# Patient Record
Sex: Male | Born: 1940 | Race: Black or African American | Hispanic: No | Marital: Single | Smoking: Never smoker
Health system: Southern US, Community
[De-identification: ages and names within clinical notes are randomized; demographics above are authoritative.]

## PROBLEM LIST (undated history)

## (undated) DIAGNOSIS — K5790 Diverticulosis of intestine, part unspecified, without perforation or abscess without bleeding: Secondary | ICD-10-CM

## (undated) DIAGNOSIS — I10 Essential (primary) hypertension: Secondary | ICD-10-CM

## (undated) DIAGNOSIS — K222 Esophageal obstruction: Secondary | ICD-10-CM

## (undated) DIAGNOSIS — K208 Other esophagitis without bleeding: Secondary | ICD-10-CM

## (undated) DIAGNOSIS — E119 Type 2 diabetes mellitus without complications: Secondary | ICD-10-CM

## (undated) HISTORY — DX: Esophageal obstruction: K22.2

## (undated) HISTORY — DX: Other esophagitis without bleeding: K20.80

---

## 2013-11-30 ENCOUNTER — Encounter (HOSPITAL_BASED_OUTPATIENT_CLINIC_OR_DEPARTMENT_OTHER): Payer: Self-pay | Admitting: Emergency Medicine

## 2013-11-30 ENCOUNTER — Emergency Department (HOSPITAL_BASED_OUTPATIENT_CLINIC_OR_DEPARTMENT_OTHER)
Admission: EM | Admit: 2013-11-30 | Discharge: 2013-11-30 | Disposition: A | Discharge status: Home / Self Care | Payer: Medicare HMO | Attending: Emergency Medicine | Admitting: Emergency Medicine

## 2013-11-30 DIAGNOSIS — E119 Type 2 diabetes mellitus without complications: Secondary | ICD-10-CM | POA: Insufficient documentation

## 2013-11-30 DIAGNOSIS — I10 Essential (primary) hypertension: Secondary | ICD-10-CM | POA: Insufficient documentation

## 2013-11-30 DIAGNOSIS — Y939 Activity, unspecified: Secondary | ICD-10-CM | POA: Insufficient documentation

## 2013-11-30 DIAGNOSIS — Y929 Unspecified place or not applicable: Secondary | ICD-10-CM | POA: Insufficient documentation

## 2013-11-30 DIAGNOSIS — T63461A Toxic effect of venom of wasps, accidental (unintentional), initial encounter: Secondary | ICD-10-CM | POA: Insufficient documentation

## 2013-11-30 DIAGNOSIS — T6391XA Toxic effect of contact with unspecified venomous animal, accidental (unintentional), initial encounter: Secondary | ICD-10-CM | POA: Insufficient documentation

## 2013-11-30 HISTORY — DX: Type 2 diabetes mellitus without complications: E11.9

## 2013-11-30 HISTORY — DX: Essential (primary) hypertension: I10

## 2013-11-30 MED ORDER — DIPHENHYDRAMINE HCL 25 MG PO CAPS
25.0000 mg | ORAL_CAPSULE | Freq: Once | ORAL | Status: AC
  Administered 2013-11-30: 25 mg via ORAL
  Filled 2013-11-30: qty 1

## 2013-11-30 MED ORDER — PREDNISONE 20 MG PO TABS: ORAL_TABLET | ORAL | Status: DC

## 2013-11-30 MED ORDER — PREDNISONE 50 MG PO TABS
60.0000 mg | ORAL_TABLET | Freq: Once | ORAL | Status: AC
  Administered 2013-11-30: 60 mg via ORAL
  Filled 2013-11-30 (×2): qty 1

## 2013-11-30 NOTE — ED Provider Notes (Signed)
CSN: [id number]     Arrival date & time 11/30/13  2040 History  This chart was scribed for Manali Mcelmurry Smitty CordsK Raimi Guillermo-Rasch, MD by Nicholos Johnsenise Iheanachor, ED scribe. This patient was seen in room MH05/MH05 and the patient's care was started at 11:16 PM.    Chief Complaint  Patient presents with  . Insect Bite   Patient is a 73 y.o. male presenting with animal bite. The history is provided by the patient. No language interpreter was used.  Animal Bite Contact animal:  Insect Location:  Hand Hand injury location:  L hand and L fingers Time since incident:  4 hours Pain details:    Severity:  Mild Incident location:  Outside Notifications:  None Relieved by:  Cold compresses Worsened by:  Nothing tried Associated symptoms: swelling   Associated symptoms: no fever    HPI Comments: Thomas Rangel is a 73 y.o. male who presents to the Emergency Department complaining of wasp sting to the left hand; onset approximately 4 hours ago. States he washed with soap and water; applied vinegar to sting area and has been using ice to reduce swelling. No other sxs to report.  Past Medical History  Diagnosis Date  . Diabetes mellitus without complication   . Hypertension    History reviewed. No pertinent past surgical history. No family history on file. History  Substance Use Topics  . Smoking status: Never Smoker   . Smokeless tobacco: Not on file  . Alcohol Use: No    Review of Systems  Constitutional: Negative for fever.  All other systems reviewed and are negative.  Allergies  Review of patient's allergies indicates no known allergies.  Home Medications   Prior to Admission medications   Not on File   BP 163/75  Pulse 86  Temp(Src) 98.7 F (37.1 C) (Oral)  Resp 16  Ht 5\' 9"  (1.753 m)  Wt 202 lb (91.627 kg)  BMI 29.82 kg/m2  SpO2 96% Physical Exam  Nursing note and vitals reviewed. Constitutional: He is oriented to person, place, and time. He appears well-developed and well-nourished. No  distress.  HENT:  Head: Normocephalic and atraumatic.  Mouth/Throat: Oropharynx is clear and moist. No oropharyngeal exudate.  No swelling of the lips, tongue, or uvula.  Eyes: Conjunctivae and EOM are normal. Pupils are equal, round, and reactive to light.  Neck: Trachea normal and normal range of motion. Neck supple. No tracheal deviation present.  Cardiovascular: Normal rate.   Pulmonary/Chest: Effort normal. No stridor. No respiratory distress.  Abdominal: Soft. Bowel sounds are normal. There is no tenderness. There is no rebound.  Musculoskeletal: Normal range of motion.  Mild swelling over 3rd 4th and 5th knucles. 3+ radial pulse. Capillary refill less than 2 seconds. No hives. No red streaking up arm.   Neurological: He is alert and oriented to person, place, and time.  Skin: Skin is warm and dry.  Psychiatric: He has a normal mood and affect. His behavior is normal.    ED Course  Procedures (including critical care time) DIAGNOSTIC STUDIES: Oxygen Saturation is 96% on room air, normal by my interpretation.    COORDINATION OF CARE: At 11:18 PM: Discussed treatment plan with patient which includes treatment with Benadryl and Prednisone. Pt instructed to continue icing the area and elevate the arm to decrease swelling. Patient agrees.   Labs Review Labs Reviewed - No data to display  Imaging Review No results found.   EKG Interpretation None      MDM   Final diagnoses:  None  local reaction to wasp sting, steroids and q 6 hr benadryl .  Return for shortness of breath or swelling of the lips or tongue or any concerns.    I personally performed the services described in this documentation, which was scribed in my presence. The recorded information has been reviewed and is accurate.     Jasmine AweApril K Yordi Krager-Rasch, MD 12/01/13 [phone]

## 2013-11-30 NOTE — ED Notes (Signed)
MD at bedside. 

## 2013-11-30 NOTE — ED Notes (Signed)
Pt reports wasp sting to left hand just pta.  Slight swelling to hand.

## 2013-12-01 ENCOUNTER — Encounter (HOSPITAL_BASED_OUTPATIENT_CLINIC_OR_DEPARTMENT_OTHER): Payer: Self-pay | Admitting: Emergency Medicine

## 2014-04-08 ENCOUNTER — Encounter (HOSPITAL_BASED_OUTPATIENT_CLINIC_OR_DEPARTMENT_OTHER): Payer: Self-pay | Admitting: *Deleted

## 2014-04-08 ENCOUNTER — Emergency Department (HOSPITAL_BASED_OUTPATIENT_CLINIC_OR_DEPARTMENT_OTHER)
Admission: EM | Admit: 2014-04-08 | Discharge: 2014-04-08 | Disposition: A | Discharge status: Home / Self Care | Payer: No Typology Code available for payment source | Attending: Emergency Medicine | Admitting: Emergency Medicine

## 2014-04-08 DIAGNOSIS — Y9389 Activity, other specified: Secondary | ICD-10-CM | POA: Insufficient documentation

## 2014-04-08 DIAGNOSIS — I10 Essential (primary) hypertension: Secondary | ICD-10-CM | POA: Insufficient documentation

## 2014-04-08 DIAGNOSIS — Z7952 Long term (current) use of systemic steroids: Secondary | ICD-10-CM | POA: Diagnosis not present

## 2014-04-08 DIAGNOSIS — S161XXA Strain of muscle, fascia and tendon at neck level, initial encounter: Secondary | ICD-10-CM | POA: Insufficient documentation

## 2014-04-08 DIAGNOSIS — Y92410 Unspecified street and highway as the place of occurrence of the external cause: Secondary | ICD-10-CM | POA: Insufficient documentation

## 2014-04-08 DIAGNOSIS — E119 Type 2 diabetes mellitus without complications: Secondary | ICD-10-CM | POA: Diagnosis not present

## 2014-04-08 DIAGNOSIS — S199XXA Unspecified injury of neck, initial encounter: Secondary | ICD-10-CM | POA: Diagnosis present

## 2014-04-08 DIAGNOSIS — Y998 Other external cause status: Secondary | ICD-10-CM | POA: Insufficient documentation

## 2014-04-08 DIAGNOSIS — Z880 Allergy status to penicillin: Secondary | ICD-10-CM | POA: Insufficient documentation

## 2014-04-08 MED ORDER — ORPHENADRINE CITRATE ER 100 MG PO TB12: 100.0000 mg | ORAL_TABLET | Freq: Two times a day (BID) | ORAL | Status: DC
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
## 2014-04-08 NOTE — Discharge Instructions (Signed)
Cervical Sprain °A cervical sprain is an injury in the neck in which the strong, fibrous tissues (ligaments) that connect your neck bones stretch or tear. Cervical sprains can range from mild to severe. Severe cervical sprains can cause the neck vertebrae to be unstable. This can lead to damage of the spinal cord and can result in serious nervous system problems. The amount of time it takes for a cervical sprain to get better depends on the cause and extent of the injury. Most cervical sprains heal in 1 to 3 weeks. °CAUSES  °Severe cervical sprains may be caused by:  °· Contact sport injuries (such as from football, rugby, wrestling, hockey, auto racing, gymnastics, diving, martial arts, or boxing).   °· Motor vehicle collisions.   °· Whiplash injuries. This is an injury from a sudden forward and backward whipping movement of the head and neck.  °· Falls.   °Mild cervical sprains may be caused by:  °· Being in an awkward position, such as while cradling a telephone between your ear and shoulder.   °· Sitting in a chair that does not offer proper support.   °· Working at a poorly designed computer station.   °· Looking up or down for long periods of time.   °SYMPTOMS  °· Pain, soreness, stiffness, or a burning sensation in the front, back, or sides of the neck. This discomfort may develop immediately after the injury or slowly, 24 hours or more after the injury.   °· Pain or tenderness directly in the middle of the back of the neck.   °· Shoulder or upper back pain.   °· Limited ability to move the neck.   °· Headache.   °· Dizziness.   °· Weakness, numbness, or tingling in the hands or arms.   °· Muscle spasms.   °· Difficulty swallowing or chewing.   °· Tenderness and swelling of the neck.   °DIAGNOSIS  °Most of the time your health care provider can diagnose a cervical sprain by taking your history and doing a physical exam. Your health care provider will ask about previous neck injuries and any known neck  problems, such as arthritis in the neck. X-rays may be taken to find out if there are any other problems, such as with the bones of the neck. Other tests, such as a CT scan or MRI, may also be needed.  °TREATMENT  °Treatment depends on the severity of the cervical sprain. Mild sprains can be treated with rest, keeping the neck in place (immobilization), and pain medicines. Severe cervical sprains are immediately immobilized. Further treatment is done to help with pain, muscle spasms, and other symptoms and may include: °· Medicines, such as pain relievers, numbing medicines, or muscle relaxants.   °· Physical therapy. This may involve stretching exercises, strengthening exercises, and posture training. Exercises and improved posture can help stabilize the neck, strengthen muscles, and help stop symptoms from returning.   °HOME CARE INSTRUCTIONS  °· Put ice on the injured area.   °¨ Put ice in a plastic bag.   °¨ Place a towel between your skin and the bag.   °¨ Leave the ice on for 15-20 minutes, 3-4 times a day.   °· If your injury was severe, you may have been given a cervical collar to wear. A cervical collar is a two-piece collar designed to keep your neck from moving while it heals. °¨ Do not remove the collar unless instructed by your health care provider. °¨ If you have long hair, keep it outside of the collar. °¨ Ask your health care provider before making any adjustments to your collar. Minor   adjustments may be required over time to improve comfort and reduce pressure on your chin or on the back of your head. °¨ If you are allowed to remove the collar for cleaning or bathing, follow your health care provider's instructions on how to do so safely. °¨ Keep your collar clean by wiping it with mild soap and water and drying it completely. If the collar you have been given includes removable pads, remove them every 1-2 days and hand wash them with soap and water. Allow them to air dry. They should be completely  dry before you wear them in the collar. °¨ If you are allowed to remove the collar for cleaning and bathing, wash and dry the skin of your neck. Check your skin for irritation or sores. If you see any, tell your health care provider. °¨ Do not drive while wearing the collar.   °· Only take over-the-counter or prescription medicines for pain, discomfort, or fever as directed by your health care provider.   °· Keep all follow-up appointments as directed by your health care provider.   °· Keep all physical therapy appointments as directed by your health care provider.   °· Make any needed adjustments to your workstation to promote good posture.   °· Avoid positions and activities that make your symptoms worse.   °· Warm up and stretch before being active to help prevent problems.   °SEEK MEDICAL CARE IF:  °· Your pain is not controlled with medicine.   °· You are unable to decrease your pain medicine over time as planned.   °· Your activity level is not improving as expected.   °SEEK IMMEDIATE MEDICAL CARE IF:  °· You develop any bleeding. °· You develop stomach upset. °· You have signs of an allergic reaction to your medicine.   °· Your symptoms get worse.   °· You develop new, unexplained symptoms.   °· You have numbness, tingling, weakness, or paralysis in any part of your body.   °MAKE SURE YOU:  °· Understand these instructions. °· Will watch your condition. °· Will get help right away if you are not doing well or get worse. °Document Released: 03/11/2007 Document Revised: 05/19/2013 Document Reviewed: 11/19/2012 °[employer]® Patient Information ©2015 [employer]. This information is not intended to replace advice given to you by your health care provider. Make sure you discuss any questions you have with your health care provider. ° °Motor Vehicle Collision °It is common to have multiple bruises and sore muscles after a motor vehicle collision (MVC). These tend to feel worse for the first 24 hours. You may have  the most stiffness and soreness over the first several hours. You may also feel worse when you wake up the first morning after your collision. After this point, you will usually begin to improve with each day. The speed of improvement often depends on the severity of the collision, the number of injuries, and the location and nature of these injuries. °HOME CARE INSTRUCTIONS °· Put ice on the injured area. °¨ Put ice in a plastic bag. °¨ Place a towel between your skin and the bag. °¨ Leave the ice on for 15-20 minutes, 3-4 times a day, or as directed by your health care provider. °· Drink enough fluids to keep your urine clear or pale yellow. Do not drink alcohol. °· Take a warm shower or bath once or twice a day. This will increase blood flow to sore muscles. °· You may return to activities as directed by your caregiver. Be careful when lifting, as this may aggravate neck or back   pain. °· Only take over-the-counter or prescription medicines for pain, discomfort, or fever as directed by your caregiver. Do not use aspirin. This may increase bruising and bleeding. °SEEK IMMEDIATE MEDICAL CARE IF: °· You have numbness, tingling, or weakness in the arms or legs. °· You develop severe headaches not relieved with medicine. °· You have severe neck pain, especially tenderness in the middle of the back of your neck. °· You have changes in bowel or bladder control. °· There is increasing pain in any area of the body. °· You have shortness of breath, light-headedness, dizziness, or fainting. °· You have chest pain. °· You feel sick to your stomach (nauseous), throw up (vomit), or sweat. °· You have increasing abdominal discomfort. °· There is blood in your urine, stool, or vomit. °· You have pain in your shoulder (shoulder strap areas). °· You feel your symptoms are getting worse. °MAKE SURE YOU: °· Understand these instructions. °· Will watch your condition. °· Will get help right away if you are not doing well or get  worse. °Document Released: 05/14/2005 Document Revised: 09/28/2013 Document Reviewed: 10/11/2010 °[employer]® Patient Information ©2015 [employer]. This information is not intended to replace advice given to you by your health care provider. Make sure you discuss any questions you have with your health care provider. ° °

## 2014-04-08 NOTE — ED Notes (Signed)
Directed to pharmacy to pick up rx- ice pack given for home use
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
## 2015-09-09 DIAGNOSIS — E1142 Type 2 diabetes mellitus with diabetic polyneuropathy: Secondary | ICD-10-CM | POA: Insufficient documentation

## 2015-09-09 DIAGNOSIS — E785 Hyperlipidemia, unspecified: Secondary | ICD-10-CM

## 2015-09-09 DIAGNOSIS — I10 Essential (primary) hypertension: Secondary | ICD-10-CM

## 2015-09-09 HISTORY — DX: Hyperlipidemia, unspecified: E78.5

## 2015-09-09 HISTORY — DX: Essential (primary) hypertension: I10

## 2015-09-09 HISTORY — DX: Type 2 diabetes mellitus with diabetic polyneuropathy: E11.42

## 2016-09-13 DIAGNOSIS — E663 Overweight: Secondary | ICD-10-CM

## 2016-09-13 DIAGNOSIS — E1142 Type 2 diabetes mellitus with diabetic polyneuropathy: Secondary | ICD-10-CM

## 2016-09-13 HISTORY — DX: Overweight: E66.3

## 2016-09-13 HISTORY — DX: Type 2 diabetes mellitus with diabetic polyneuropathy: E11.42

## 2017-04-30 ENCOUNTER — Other Ambulatory Visit: Payer: Self-pay

## 2017-04-30 ENCOUNTER — Inpatient Hospital Stay (HOSPITAL_BASED_OUTPATIENT_CLINIC_OR_DEPARTMENT_OTHER)
Admission: EM | Admit: 2017-04-30 | Discharge: 2017-05-03 | DRG: 378 | Disposition: A | Discharge status: Home / Self Care | Payer: Medicare HMO | Attending: Internal Medicine | Admitting: Internal Medicine

## 2017-04-30 ENCOUNTER — Inpatient Hospital Stay (HOSPITAL_COMMUNITY): Payer: Medicare HMO

## 2017-04-30 ENCOUNTER — Encounter (HOSPITAL_BASED_OUTPATIENT_CLINIC_OR_DEPARTMENT_OTHER): Payer: Self-pay | Admitting: Emergency Medicine

## 2017-04-30 DIAGNOSIS — E118 Type 2 diabetes mellitus with unspecified complications: Secondary | ICD-10-CM

## 2017-04-30 DIAGNOSIS — E785 Hyperlipidemia, unspecified: Secondary | ICD-10-CM | POA: Diagnosis present

## 2017-04-30 DIAGNOSIS — K208 Other esophagitis without bleeding: Secondary | ICD-10-CM

## 2017-04-30 DIAGNOSIS — Z79899 Other long term (current) drug therapy: Secondary | ICD-10-CM | POA: Diagnosis not present

## 2017-04-30 DIAGNOSIS — D62 Acute posthemorrhagic anemia: Secondary | ICD-10-CM

## 2017-04-30 DIAGNOSIS — E860 Dehydration: Secondary | ICD-10-CM

## 2017-04-30 DIAGNOSIS — D72829 Elevated white blood cell count, unspecified: Secondary | ICD-10-CM

## 2017-04-30 DIAGNOSIS — N179 Acute kidney failure, unspecified: Secondary | ICD-10-CM

## 2017-04-30 DIAGNOSIS — K5790 Diverticulosis of intestine, part unspecified, without perforation or abscess without bleeding: Secondary | ICD-10-CM | POA: Diagnosis present

## 2017-04-30 DIAGNOSIS — D72828 Other elevated white blood cell count: Secondary | ICD-10-CM | POA: Diagnosis present

## 2017-04-30 DIAGNOSIS — Z7984 Long term (current) use of oral hypoglycemic drugs: Secondary | ICD-10-CM

## 2017-04-30 DIAGNOSIS — B3781 Candidal esophagitis: Secondary | ICD-10-CM | POA: Diagnosis present

## 2017-04-30 DIAGNOSIS — E876 Hypokalemia: Secondary | ICD-10-CM

## 2017-04-30 DIAGNOSIS — K922 Gastrointestinal hemorrhage, unspecified: Secondary | ICD-10-CM

## 2017-04-30 DIAGNOSIS — R7989 Other specified abnormal findings of blood chemistry: Secondary | ICD-10-CM | POA: Diagnosis present

## 2017-04-30 DIAGNOSIS — D509 Iron deficiency anemia, unspecified: Secondary | ICD-10-CM | POA: Diagnosis present

## 2017-04-30 DIAGNOSIS — I10 Essential (primary) hypertension: Secondary | ICD-10-CM | POA: Diagnosis not present

## 2017-04-30 DIAGNOSIS — Z880 Allergy status to penicillin: Secondary | ICD-10-CM

## 2017-04-30 DIAGNOSIS — E119 Type 2 diabetes mellitus without complications: Secondary | ICD-10-CM | POA: Diagnosis present

## 2017-04-30 DIAGNOSIS — K219 Gastro-esophageal reflux disease without esophagitis: Secondary | ICD-10-CM | POA: Diagnosis present

## 2017-04-30 DIAGNOSIS — K449 Diaphragmatic hernia without obstruction or gangrene: Secondary | ICD-10-CM | POA: Diagnosis present

## 2017-04-30 DIAGNOSIS — K5731 Diverticulosis of large intestine without perforation or abscess with bleeding: Principal | ICD-10-CM | POA: Diagnosis present

## 2017-04-30 DIAGNOSIS — Z8711 Personal history of peptic ulcer disease: Secondary | ICD-10-CM | POA: Diagnosis not present

## 2017-04-30 DIAGNOSIS — K222 Esophageal obstruction: Secondary | ICD-10-CM

## 2017-04-30 DIAGNOSIS — K921 Melena: Secondary | ICD-10-CM | POA: Diagnosis present

## 2017-04-30 DIAGNOSIS — D638 Anemia in other chronic diseases classified elsewhere: Secondary | ICD-10-CM | POA: Diagnosis present

## 2017-04-30 HISTORY — DX: Elevated white blood cell count, unspecified: D72.829

## 2017-04-30 HISTORY — DX: Dehydration: E86.0

## 2017-04-30 HISTORY — DX: Hypokalemia: E87.6

## 2017-04-30 HISTORY — DX: Acute kidney failure, unspecified: N17.9

## 2017-04-30 HISTORY — DX: Type 2 diabetes mellitus without complications: E11.9

## 2017-04-30 HISTORY — DX: Essential (primary) hypertension: I10

## 2017-04-30 HISTORY — DX: Gastrointestinal hemorrhage, unspecified: K92.2

## 2017-04-30 HISTORY — DX: Acute posthemorrhagic anemia: D62

## 2017-04-30 HISTORY — DX: Diverticulosis of intestine, part unspecified, without perforation or abscess without bleeding: K57.90

## 2017-04-30 LAB — IRON AND TIBC
Iron: 56 ug/dL (ref 45–182)
SATURATION RATIOS: 23 % (ref 17.9–39.5)
TIBC: 248 ug/dL — AB (ref 250–450)
UIBC: 192 ug/dL

## 2017-04-30 LAB — BASIC METABOLIC PANEL
ANION GAP: 5 (ref 5–15)
Anion gap: 9 (ref 5–15)
BUN: 26 mg/dL — ABNORMAL HIGH (ref 6–20)
BUN: 22 mg/dL — ABNORMAL HIGH (ref 6–20)
CHLORIDE: 103 mmol/L (ref 101–111)
CO2: 23 mmol/L (ref 22–32)
CO2: 25 mmol/L (ref 22–32)
CREATININE: 1.59 mg/dL — AB (ref 0.61–1.24)
Calcium: 8.6 mg/dL — ABNORMAL LOW (ref 8.9–10.3)
Calcium: 7.9 mg/dL — ABNORMAL LOW (ref 8.9–10.3)
Chloride: 109 mmol/L (ref 101–111)
Creatinine, Ser: 1.11 mg/dL (ref 0.61–1.24)
GFR calc Af Amer: 47 mL/min — ABNORMAL LOW (ref >60)
GFR calc Af Amer: >60 mL/min (ref >60)
GFR calc non Af Amer: 41 mL/min — ABNORMAL LOW (ref >60)
Glucose, Bld: 311 mg/dL — ABNORMAL HIGH (ref 65–99)
Glucose, Bld: 157 mg/dL — ABNORMAL HIGH (ref 65–99)
POTASSIUM: 3.6 mmol/L (ref 3.5–5.1)
Potassium: 3.4 mmol/L — ABNORMAL LOW (ref 3.5–5.1)
SODIUM: 139 mmol/L (ref 135–145)
Sodium: 135 mmol/L (ref 135–145)

## 2017-04-30 LAB — CBC WITH DIFFERENTIAL/PLATELET
Basophils Absolute: 0 10*3/uL (ref 0.0–0.1)
Basophils Relative: 0 %
EOS ABS: 0.2 10*3/uL (ref 0.0–0.7)
Eosinophils Relative: 1 %
HCT: 33 % — ABNORMAL LOW (ref 39.0–52.0)
HEMOGLOBIN: 11.2 g/dL — AB (ref 13.0–17.0)
LYMPHS ABS: 3.4 10*3/uL (ref 0.7–4.0)
Lymphocytes Relative: 22 %
MCH: 32.6 pg (ref 26.0–34.0)
MCHC: 33.9 g/dL (ref 30.0–36.0)
MCV: 95.9 fL (ref 78.0–100.0)
MONOS PCT: 5 %
Monocytes Absolute: 0.8 10*3/uL (ref 0.1–1.0)
NEUTROS PCT: 72 %
Neutro Abs: 11.2 10*3/uL — ABNORMAL HIGH (ref 1.7–7.7)
Platelets: 178 10*3/uL (ref 150–400)
RBC: 3.44 MIL/uL — ABNORMAL LOW (ref 4.22–5.81)
RDW: 12.1 % (ref 11.5–15.5)
WBC: 15.6 10*3/uL — ABNORMAL HIGH (ref 4.0–10.5)

## 2017-04-30 LAB — HEPATIC FUNCTION PANEL
ALBUMIN: 3.1 g/dL — AB (ref 3.5–5.0)
ALK PHOS: 39 U/L (ref 38–126)
ALT: 13 U/L — AB (ref 17–63)
AST: 20 U/L (ref 15–41)
BILIRUBIN TOTAL: 0.6 mg/dL (ref 0.3–1.2)
Bilirubin, Direct: <0.1 mg/dL — ABNORMAL LOW (ref 0.1–0.5)
TOTAL PROTEIN: 5.5 g/dL — AB (ref 6.5–8.1)

## 2017-04-30 LAB — URINALYSIS, ROUTINE W REFLEX MICROSCOPIC
Bilirubin Urine: NEGATIVE
GLUCOSE, UA: NEGATIVE mg/dL
HGB URINE DIPSTICK: NEGATIVE
KETONES UR: NEGATIVE mg/dL
LEUKOCYTES UA: NEGATIVE
Nitrite: NEGATIVE
PH: 5 (ref 5.0–8.0)
Protein, ur: NEGATIVE mg/dL
Specific Gravity, Urine: 1.011 (ref 1.005–1.030)

## 2017-04-30 LAB — CBC
HEMATOCRIT: 26.8 % — AB (ref 39.0–52.0)
HEMATOCRIT: 26.5 % — AB (ref 39.0–52.0)
HEMOGLOBIN: 9.1 g/dL — AB (ref 13.0–17.0)
Hemoglobin: 9.1 g/dL — ABNORMAL LOW (ref 13.0–17.0)
MCH: 32.7 pg (ref 26.0–34.0)
MCH: 32.6 pg (ref 26.0–34.0)
MCHC: 34 g/dL (ref 30.0–36.0)
MCHC: 34.3 g/dL (ref 30.0–36.0)
MCV: 96.4 fL (ref 78.0–100.0)
MCV: 95 fL (ref 78.0–100.0)
Platelets: 148 10*3/uL — ABNORMAL LOW (ref 150–400)
Platelets: 147 10*3/uL — ABNORMAL LOW (ref 150–400)
RBC: 2.78 MIL/uL — ABNORMAL LOW (ref 4.22–5.81)
RBC: 2.79 MIL/uL — ABNORMAL LOW (ref 4.22–5.81)
RDW: 12 % (ref 11.5–15.5)
RDW: 12.7 % (ref 11.5–15.5)
WBC: 12 10*3/uL — ABNORMAL HIGH (ref 4.0–10.5)
WBC: 10.6 10*3/uL — AB (ref 4.0–10.5)

## 2017-04-30 LAB — CBG MONITORING, ED: Glucose-Capillary: 162 mg/dL — ABNORMAL HIGH (ref 65–99)

## 2017-04-30 LAB — SODIUM, URINE, RANDOM: Sodium, Ur: 85 mmol/L

## 2017-04-30 LAB — HEMOGLOBIN A1C
Hgb A1c MFr Bld: 6.6 % — ABNORMAL HIGH (ref 4.8–5.6)
MEAN PLASMA GLUCOSE: 142.72 mg/dL

## 2017-04-30 LAB — FOLATE: FOLATE: 27 ng/mL (ref >5.9)

## 2017-04-30 LAB — VITAMIN B12: VITAMIN B 12: 878 pg/mL (ref 180–914)

## 2017-04-30 LAB — FERRITIN: Ferritin: 127 ng/mL (ref 24–336)

## 2017-04-30 LAB — CREATININE, URINE, RANDOM: Creatinine, Urine: 54.45 mg/dL

## 2017-04-30 LAB — MAGNESIUM: Magnesium: 1.5 mg/dL — ABNORMAL LOW (ref 1.7–2.4)

## 2017-04-30 LAB — RETICULOCYTES
RBC.: 2.74 MIL/uL — ABNORMAL LOW (ref 4.22–5.81)
Retic Count, Absolute: 54.8 10*3/uL (ref 19.0–186.0)
Retic Ct Pct: 2 % (ref 0.4–3.1)

## 2017-04-30 LAB — OCCULT BLOOD X 1 CARD TO LAB, STOOL: Fecal Occult Bld: POSITIVE — AB

## 2017-04-30 MED ORDER — PANTOPRAZOLE SODIUM 40 MG IV SOLR
40.0000 mg | Freq: Two times a day (BID) | INTRAVENOUS | Status: DC
  Administered 2017-04-30 – 2017-05-01 (×2): 40 mg via INTRAVENOUS
  Filled 2017-04-30 (×2): qty 40

## 2017-04-30 MED ORDER — ONDANSETRON HCL 4 MG PO TABS: 4.0000 mg | ORAL_TABLET | Freq: Four times a day (QID) | ORAL | Status: DC | PRN

## 2017-04-30 MED ORDER — SENNOSIDES-DOCUSATE SODIUM 8.6-50 MG PO TABS: 1.0000 | ORAL_TABLET | Freq: Every evening | ORAL | Status: DC | PRN

## 2017-04-30 MED ORDER — MAGNESIUM SULFATE 4 GM/100ML IV SOLN
4.0000 g | Freq: Once | INTRAVENOUS | Status: AC
  Administered 2017-04-30: 4 g via INTRAVENOUS
  Filled 2017-04-30: qty 100

## 2017-04-30 MED ORDER — DILTIAZEM HCL ER COATED BEADS 120 MG PO CP24
120.0000 mg | ORAL_CAPSULE | Freq: Every day | ORAL | Status: DC
  Administered 2017-04-30 – 2017-05-03 (×2): 120 mg via ORAL
  Filled 2017-04-30 (×3): qty 1

## 2017-04-30 MED ORDER — SODIUM CHLORIDE 0.9 % IV BOLUS (SEPSIS)
1000.0000 mL | Freq: Once | INTRAVENOUS | Status: AC
  Administered 2017-04-30: 1000 mL via INTRAVENOUS

## 2017-04-30 MED ORDER — INSULIN ASPART 100 UNIT/ML ~~LOC~~ SOLN
0.0000 [IU] | Freq: Three times a day (TID) | SUBCUTANEOUS | Status: DC
  Administered 2017-05-01 (×2): 1 [IU] via SUBCUTANEOUS

## 2017-04-30 MED ORDER — OXYCODONE HCL 5 MG PO TABS: 5.0000 mg | ORAL_TABLET | ORAL | Status: DC | PRN

## 2017-04-30 MED ORDER — ALBUTEROL SULFATE (2.5 MG/3ML) 0.083% IN NEBU: 2.5000 mg | INHALATION_SOLUTION | RESPIRATORY_TRACT | Status: DC | PRN

## 2017-04-30 MED ORDER — SODIUM CHLORIDE 0.9 % IV SOLN
INTRAVENOUS | Status: DC
  Administered 2017-04-30 – 2017-05-02 (×2): via INTRAVENOUS

## 2017-04-30 MED ORDER — SORBITOL 70 % SOLN: 30.0000 mL | Freq: Every day | Status: DC | PRN

## 2017-04-30 MED ORDER — ACETAMINOPHEN 650 MG RE SUPP: 650.0000 mg | Freq: Four times a day (QID) | RECTAL | Status: DC | PRN

## 2017-04-30 MED ORDER — HYDRALAZINE HCL 20 MG/ML IJ SOLN: 10.0000 mg | Freq: Four times a day (QID) | INTRAMUSCULAR | Status: DC | PRN

## 2017-04-30 MED ORDER — ACETAMINOPHEN 325 MG PO TABS: 650.0000 mg | ORAL_TABLET | Freq: Four times a day (QID) | ORAL | Status: DC | PRN

## 2017-04-30 MED ORDER — POTASSIUM CHLORIDE CRYS ER 20 MEQ PO TBCR
40.0000 meq | EXTENDED_RELEASE_TABLET | Freq: Once | ORAL | Status: AC
  Administered 2017-04-30: 40 meq via ORAL
  Filled 2017-04-30: qty 2

## 2017-04-30 MED ORDER — ONDANSETRON HCL 4 MG/2ML IJ SOLN: 4.0000 mg | Freq: Four times a day (QID) | INTRAMUSCULAR | Status: DC | PRN

## 2017-04-30 NOTE — ED Notes (Signed)
Spoke to Dr Onalee Huaavid, made her aware about patient's new low  Hgb level 9,1. Pt is on admission [facility]. Spoke to Dr Adela LankFloyd, ER provider,  about the possibility to assign higher acuity bed request  due to the changes in pt's condition.

## 2017-04-30 NOTE — ED Notes (Signed)
Lorne SkeensStacey West,[facility] to [facility] for [facility] bed. Pt and family also notified

## 2017-04-30 NOTE — Progress Notes (Signed)
Received report from [facility], settled pt in room, VSs. Denies pain. Floor manager paged. SRP, RN

## 2017-04-30 NOTE — ED Notes (Signed)
Pt had total of 4 bloody BM. Bright red blood noticed in toilet. Dr Adela LankFloyd made aware, second CBC will be ordered.

## 2017-04-30 NOTE — Progress Notes (Signed)
CN updated on pt status and report given to Otho PerlPam Garman, RN.  Pt stable at handoff. Current RN signed off. SRP, RN

## 2017-04-30 NOTE — ED Provider Notes (Signed)
[facility] EMERGENCY DEPARTMENT Provider Note   CSN: [id number] Arrival date & time: 04/30/17  0746     History   Chief Complaint Chief Complaint  Patient presents with  . Rectal Bleeding    HPI Thomas Rangel is a 76 y.o. male.  76 yo M with a chief complaint of bright red blood per rectum.  The patient has had 3 episodes of this this morning.  He had some mild cramping to his abdomen prior to the onset and has not had pain since.  Denies nausea or vomiting denies fevers.  No history of prior bleeding episodes.  Denies history of hemorrhoids.  Denies blood thinner use.  Denies NSAID use.   The history is provided by the patient.  Rectal Bleeding  Quality:  Bright red Amount:  Moderate Duration:  2 hours Timing:  Constant Chronicity:  New Similar prior episodes: no   Relieved by:  Nothing Worsened by:  Nothing Ineffective treatments:  None tried Associated symptoms: no abdominal pain, no fever and no vomiting     Past Medical History:  Diagnosis Date  . Diabetes mellitus without complication (HCC)   . Hypertension     Patient Active Problem List   Diagnosis Date Noted  . GIB (gastrointestinal bleeding) 04/30/2017    History reviewed. No pertinent surgical history.     Home Medications    Prior to Admission medications   Medication Sig Start Date End Date Taking? Authorizing Provider  glimepiride (AMARYL) 2 MG tablet Take 2 mg by mouth daily with breakfast.   Yes [provider]  hydrochlorothiazide (HYDRODIURIL) 25 MG tablet Take 25 mg by mouth daily.   Yes [provider]  lisinopril (PRINIVIL,ZESTRIL) 30 MG tablet Take 30 mg by mouth daily.   Yes [provider]  pioglitazone (ACTOS) 30 MG tablet Take 30 mg by mouth daily.   Yes [provider]  Atorvastatin Calcium (LIPITOR PO) Take by mouth.    [provider]  Diltiazem HCl Coated Beads (DILTIAZEM CD PO) Take by mouth.    [provider]    orphenadrine (NORFLEX) 100 MG tablet Take 1 tablet (100 mg total) by mouth 2 (two) times daily. 04/08/14   Arby Barrette, MD  predniSONE (DELTASONE) 20 MG tablet 3 tabs po day one, then 2 po daily x 4 days 11/30/13   Nicanor Alcon, April, MD    Family History History reviewed. No pertinent family history.  Social History Social History   Tobacco Use  . Smoking status: Never Smoker  . Smokeless tobacco: Never Used  Substance Use Topics  . Alcohol use: No  . Drug use: No     Allergies   Penicillins   Review of Systems Review of Systems  Constitutional: Negative for chills and fever.  HENT: Negative for congestion and facial swelling.   Eyes: Negative for discharge and visual disturbance.  Respiratory: Negative for shortness of breath.   Cardiovascular: Negative for chest pain and palpitations.  Gastrointestinal: Positive for blood in stool and hematochezia. Negative for abdominal pain, diarrhea and vomiting.  Musculoskeletal: Negative for arthralgias and myalgias.  Skin: Negative for color change and rash.  Neurological: Negative for tremors, syncope and headaches.  Psychiatric/Behavioral: Negative for confusion and dysphoric mood.     Physical Exam Updated Vital Signs BP 133/68   Pulse 79   Temp 98.2 F (36.8 C) (Oral)   Resp (!) 8   Ht 5\' 9"  (1.753 m)   Wt 90.7 kg (200 lb)  SpO2 100%   BMI 29.53 kg/m   Physical Exam  Constitutional: He is oriented to person, place, and time. He appears well-developed and well-nourished.  HENT:  Head: Normocephalic and atraumatic.  Eyes: EOM are normal. Pupils are equal, round, and reactive to light.  Neck: Normal range of motion. Neck supple. No JVD present.  Cardiovascular: Regular rhythm. Tachycardia present. Exam reveals no gallop and no friction rub.  No murmur heard. Pulmonary/Chest: No respiratory distress. He has no wheezes.  Abdominal: He exhibits no distension and no mass. There is no tenderness. There is no rebound  and no guarding.  Genitourinary:  Genitourinary Comments: Gross blood on rectal exam no hemorrhoids  Musculoskeletal: Normal range of motion.  Neurological: He is alert and oriented to person, place, and time.  Skin: No rash noted. No pallor.  Psychiatric: He has a normal mood and affect. His behavior is normal.  Nursing note and vitals reviewed.    ED Treatments / Results  Labs (all labs ordered are listed, but only abnormal results are displayed) Labs Reviewed  CBC WITH DIFFERENTIAL/PLATELET - Abnormal; Notable for the following components:      Result Value   WBC 15.6 (*)    RBC 3.44 (*)    Hemoglobin 11.2 (*)    HCT 33.0 (*)    Neutro Abs 11.2 (*)    All other components within normal limits  BASIC METABOLIC PANEL - Abnormal; Notable for the following components:   Potassium 3.4 (*)    Glucose, Bld 311 (*)    BUN 26 (*)    Creatinine, Ser 1.59 (*)    Calcium 8.6 (*)    GFR calc non Af Amer 41 (*)    GFR calc Af Amer 47 (*)    All other components within normal limits  OCCULT BLOOD X 1 CARD TO LAB, STOOL - Abnormal; Notable for the following components:   Fecal Occult Bld POSITIVE (*)    All other components within normal limits  CBC - Abnormal; Notable for the following components:   WBC 12.0 (*)    RBC 2.78 (*)    Hemoglobin 9.1 (*)    HCT 26.8 (*)    Platelets 148 (*)    All other components within normal limits  CBG MONITORING, ED - Abnormal; Notable for the following components:   Glucose-Capillary 162 (*)    All other components within normal limits    EKG  EKG Interpretation None       Radiology No results found.  Procedures Procedures (including critical care time)  Medications Ordered in ED Medications  sodium chloride 0.9 % bolus 1,000 mL (0 mLs Intravenous Stopped 04/30/17 0900)     Initial Impression / Assessment and Plan / ED Course  I have reviewed the triage vital signs and the nursing notes.  Pertinent labs & imaging results that  were available during my care of the patient were reviewed by me and considered in my medical decision making (see chart for details).     76 yo M with a chief complaint of bright red blood per rectum.  The patient has had 3 episodes this morning.  He has gross blood on my rectal exam.  He is mildly tachycardic.  Given a liter of fluids.  His hemoglobin appears to be 2 g lower than what it was measured in July.  Discussed the case with Dr. Bosie ClosSchooler, GI recommends medical admission and reconsult when he arrives to [facility].  Unfortunately the hospitalist for  they are currently working on beds.  Repeat check of the hemoglobin the patient's hemoglobin is dropped to 9.  He had three more bowel movements while in the ED.  Continues to be hemodynamically stable.  I discussed with the patient the concern that there is limited resources here.  I have approved to transfer him to another facility.  The family is not comfortable going further out of the area.  There are no beds at [facility].  At this point I feel it is unsafe to keep him in the emergency department here.  I will transfer him to the emergency department at [facility] where there are more resources should the patient have continued worsening.  CRITICAL CARE Performed by: Rae Roamaniel Patrick Deardra Hinkley   Total critical care time: 35 minutes  Critical care time was exclusive of separately billable procedures and treating other patients.  Critical care was necessary to treat or prevent imminent or life-threatening deterioration.  Critical care was time spent personally by me on the following activities: development of treatment plan with patient and/or surrogate as well as nursing, discussions with consultants, evaluation of patient's response to treatment, examination of patient, obtaining history from patient or surrogate, ordering and performing treatments and interventions, ordering and review of laboratory studies, ordering and review of  radiographic studies, pulse oximetry and re-evaluation of patient's condition.  The patients results and plan were reviewed and discussed.   Any x-rays performed were independently reviewed by myself.   Differential diagnosis were considered with the presenting HPI.  Medications  sodium chloride 0.9 % bolus 1,000 mL (0 mLs Intravenous Stopped 04/30/17 0900)    Vitals:   04/30/17 1400 04/30/17 1430 04/30/17 1500 04/30/17 1530  BP: (!) 109/59 113/65 127/67 133/68  Pulse: 76 79 81 79  Resp: (!) 0 15 11 (!) 8  Temp:      TempSrc:      SpO2: 98% 97% 99% 100%  Weight:      Height:        Final diagnoses:  Lower GI bleed    Admission/ observation were discussed with the admitting physician, patient and/or family and they are comfortable with the plan.   Final Clinical Impressions(s) / ED Diagnoses   Final diagnoses:  Lower GI bleed    ED Discharge Orders    None       Melene PlanFloyd, Dolph Tavano, DO 04/30/17 1559

## 2017-04-30 NOTE — ED Triage Notes (Signed)
Pt reports bright red rectal bleeding started this Am. No GI HX. denies abd pain. Takes baby aspirin x 3 weekly. Hx HTN. And type II diabetes. Alert and orinted x 4.

## 2017-04-30 NOTE — ED Notes (Signed)
Placed on cardiac monitor, BP cycling q30.

## 2017-04-30 NOTE — Plan of Care (Signed)
  Progressing Health Behavior/Discharge Planning: Ability to manage health-related needs will improve 04/30/2017 2336 - Progressing by Cristela FeltSteffens, Jerrel Tiberio P, RN Clinical Measurements: Ability to maintain clinical measurements within normal limits will improve 04/30/2017 2336 - Progressing by Cristela FeltSteffens, Legacy Lacivita P, RN Will remain free from infection 04/30/2017 2336 - Progressing by Cristela FeltSteffens, Jahvon Gosline P, RN Diagnostic test results will improve 04/30/2017 2336 - Progressing by Cristela FeltSteffens, Kanishk Stroebel P, RN Respiratory complications will improve 04/30/2017 2336 - Progressing by Cristela FeltSteffens, Muzamil Harker P, RN Cardiovascular complication will be avoided 04/30/2017 2336 - Progressing by Cristela FeltSteffens, Cebastian Neis P, RN Activity: Risk for activity intolerance will decrease 04/30/2017 2336 - Progressing by Cristela FeltSteffens, Tru Rana P, RN Nutrition: Adequate nutrition will be maintained 04/30/2017 2336 - Progressing by Cristela FeltSteffens, Belkys Henault P, RN Coping: Level of anxiety will decrease 04/30/2017 2336 - Progressing by Cristela FeltSteffens, Hilman Kissling P, RN Elimination: Will not experience complications related to urinary retention 04/30/2017 2336 - Progressing by Cristela FeltSteffens, Stran Raper P, RN Pain Managment: General experience of comfort will improve 04/30/2017 2336 - Progressing by Cristela FeltSteffens, Ashaki Frosch P, RN Skin Integrity: Risk for impaired skin integrity will decrease 04/30/2017 2336 - Progressing by Cristela FeltSteffens, Hanson Medeiros P, RN Clinical Measurements: Complications related to the disease process, condition or treatment will be avoided or minimized 04/30/2017 2336 - Progressing by Cristela FeltSteffens, Gearldene Fiorenza P, RN

## 2017-04-30 NOTE — H&P (Signed)
History and Physical    Tracker Mance WUJ:[id number] DOB: [birth year] DOA: 04/30/2017  PCP: Patient, No Pcp Per Dr.Haynes [place], [place], [place] Patient coming from: Home  I have personally briefly reviewed patient's old medical records in [facility]  Chief Complaint: Bright red blood per rectum  HPI: Thomas Rangel is a 76 y.o. male with medical history significant of hypertension, diabetes, hyperlipidemia, gastroesophageal reflux disease, remote history of peptic ulcer disease per patient who presents to the ED with sudden onset bright red blood/maroon blood per rectum.  Patient states he awoke in the morning of admission had one episode of abdominal pain and thought he had to have a bowel movement when he had went to have a bowel movement had bloody bowel movements.  Patient stated had 3 maroon colored stools and subsequently presented to the ED at the med center [facility]  Patient states while in the ED had 3-4 more bloody bowel movements.  Patient denied any further abdominal pain.  Patient denies any fevers, no chills, no use of NSAIDs, no chest pain, no shortness of breath, no nausea, no vomiting, no cough, no dysuria, no hematemesis, no melena.  She endorses some lightheadedness.  Patient endorses a history of gastric esophageal reflux disease as well as a remote history of peptic ulcer disease.  Patient with no prior history of a GI bleed.  Patient denies any history of colonoscopy.  ED Course: Patient seen in the ED and noted to be fecal occult blood positive.  Basic metabolic profile obtained had a potassium of 3.4, glucose of 311, BUN of 26, creatinine of 1.59 otherwise was within normal limits.  CBC initially had a white count of 15.6, hemoglobin of 11.2, platelet count of 178.  CBC drawn 6 hours later had a white count of 12.0 and a hemoglobin dropped to 9.1.  ED physician spoke with Dr. Myrtie Cruise gastroenterology recommendations were to admit the patient to the  hospital with GI consultation.  Patient subsequently was sent to [facility] as a direct admit and had an episode of bloody bowel movements on presentation.  Review of Systems: As per HPI otherwise 10 point review of systems negative.   Past Medical History:  Diagnosis Date  . Diabetes mellitus without complication (HCC)   . DM type 2 (diabetes mellitus, type 2) (HCC) 04/30/2017  . HTN (hypertension) 04/30/2017  . Hypertension     History reviewed. No pertinent surgical history.   reports that  has never smoked. he has never used smokeless tobacco. He reports that he does not drink alcohol or use drugs.  Allergies  Allergen Reactions  . Penicillins Rash    Family History  Problem Relation Age of Onset  . CVA Mother   . CVA Father    Mother deceased age 74 from an acute CVA.  Father deceased age 57 from an acute CVA  Prior to Admission medications   Medication Sig Start Date End Date Taking? Authorizing Provider  glimepiride (AMARYL) 2 MG tablet Take 2 mg by mouth daily with breakfast.   Yes [provider]  hydrochlorothiazide (HYDRODIURIL) 25 MG tablet Take 25 mg by mouth daily.   Yes [provider]  lisinopril (PRINIVIL,ZESTRIL) 30 MG tablet Take 30 mg by mouth daily.   Yes [provider]  pioglitazone (ACTOS) 30 MG tablet Take 30 mg by mouth daily.   Yes [provider]  Atorvastatin Calcium (LIPITOR PO) Take by mouth.    [provider]  Diltiazem  HCl Coated Beads (DILTIAZEM CD PO) Take by mouth.    [provider]  orphenadrine (NORFLEX) 100 MG tablet Take 1 tablet (100 mg total) by mouth 2 (two) times daily. 04/08/14   Arby BarrettePfeiffer, Marcy, MD  predniSONE (DELTASONE) 20 MG tablet 3 tabs po day one, then 2 po daily x 4 days 11/30/13   Nicanor AlconPalumbo, April, MD    Physical Exam: Vitals:   04/30/17 1500 04/30/17 1530 04/30/17 1600 04/30/17 1726  BP: 127/67 133/68 134/71 (!) 154/77  Pulse: 81 79 83 (!) 102  Resp: 11 (!) 8 (!) 0 12   Temp:    98.5 F (36.9 C)  TempSrc:    Oral  SpO2: 99% 100% 98% 100%  Weight:    87.9 kg (193 lb 12.6 oz)  Height:    5\' 10"  (1.778 m)    Constitutional: NAD, calm, comfortable Vitals:   04/30/17 1500 04/30/17 1530 04/30/17 1600 04/30/17 1726  BP: 127/67 133/68 134/71 (!) 154/77  Pulse: 81 79 83 (!) 102  Resp: 11 (!) 8 (!) 0 12  Temp:    98.5 F (36.9 C)  TempSrc:    Oral  SpO2: 99% 100% 98% 100%  Weight:    87.9 kg (193 lb 12.6 oz)  Height:    5\' 10"  (1.778 m)   Eyes: PERRLA, EOMI, lids and conjunctivae normal ENMT: Mucous membranes are moist. Posterior pharynx clear of any exudate or lesions.Normal dentition.  Neck: normal, supple, no masses, no thyromegaly Respiratory: clear to auscultation bilaterally, no wheezing, no crackles. Normal respiratory effort. No accessory muscle use.  Cardiovascular: Regular rate and rhythm, no murmurs / rubs / gallops. No extremity edema. 2+ pedal pulses. No carotid bruits.  Abdomen: no tenderness, no masses palpated. No hepatosplenomegaly. Bowel sounds positive.  Musculoskeletal: no clubbing / cyanosis. No joint deformity upper and lower extremities. Good ROM, no contractures. Normal muscle tone.  Skin: no rashes, lesions, ulcers. No induration Neurologic: CN 2-12 grossly intact. Sensation intact, DTR normal. Strength 5/5 in all 4.  Psychiatric: Normal judgment and insight. Alert and oriented x 3. Normal mood.   Labs on Admission: I have personally reviewed following labs and imaging studies  CBC: Recent Labs  Lab 04/30/17 0810 04/30/17 1445  WBC 15.6* 12.0*  NEUTROABS 11.2*  --   HGB 11.2* 9.1*  HCT 33.0* 26.8*  MCV 95.9 96.4  PLT 178 148*   Basic Metabolic Panel: Recent Labs  Lab 04/30/17 0810  NA 135  K 3.4*  CL 103  CO2 23  GLUCOSE 311*  BUN 26*  CREATININE 1.59*  CALCIUM 8.6*   GFR: Estimated Creatinine Clearance: 44.9 mL/min (A) (by C-G formula based on SCr of 1.59 mg/dL (H)). Liver Function Tests: No results for  input(s): AST, ALT, ALKPHOS, BILITOT, PROT, ALBUMIN in the last 168 hours. No results for input(s): LIPASE, AMYLASE in the last 168 hours. No results for input(s): AMMONIA in the last 168 hours. Coagulation Profile: No results for input(s): INR, PROTIME in the last 168 hours. Cardiac Enzymes: No results for input(s): CKTOTAL, CKMB, CKMBINDEX, TROPONINI in the last 168 hours. BNP (last 3 results) No results for input(s): PROBNP in the last 8760 hours. HbA1C: No results for input(s): HGBA1C in the last 72 hours. CBG: Recent Labs  Lab 04/30/17 1307  GLUCAP 162*   Lipid Profile: No results for input(s): CHOL, HDL, LDLCALC, TRIG, CHOLHDL, LDLDIRECT in the last 72 hours. Thyroid Function Tests: No results for input(s): TSH, T4TOTAL, FREET4, T3FREE, THYROIDAB in the last  72 hours. Anemia Panel: No results for input(s): VITAMINB12, FOLATE, FERRITIN, TIBC, IRON, RETICCTPCT in the last 72 hours. Urine analysis: No results found for: COLORURINE, APPEARANCEUR, LABSPEC, PHURINE, GLUCOSEU, HGBUR, BILIRUBINUR, KETONESUR, PROTEINUR, UROBILINOGEN, NITRITE, LEUKOCYTESUR  Radiological Exams on Admission: No results found.  EKG: Not done  Assessment/Plan Principal Problem:   GIB (gastrointestinal bleeding) Active Problems:   Acute blood loss anemia   Hypokalemia   AKI (acute kidney injury) (HCC)   Dehydration   HTN (hypertension)   Leukocytosis   DM type 2 (diabetes mellitus, type 2) (HCC)   #1 GI bleed Patient is presenting with multiple episodes of bloody bowel movements described as maroon colored.  The patient had 3 bloody bowel movements prior to presentation in the ED and subsequently had 3-4 bloody bowel movements in the ED and one bloody bowel movement since his arrival to the floor.  Patient noted to have a 2 g drop in his hemoglobin since presentation to the ED and 6 hours later.  Patient with no history of NSAID use.  Patient states has a remote history of peptic ulcer disease.   Patient with no abdominal pain or cramping.  Admit patient to telemetry.  Serial CBCs every 8 hours.  Check an anemia panel.  Placed on PPI IV every 12 hours.  Clear liquids for now, n.p.o. after midnight.  Gastroenterology has been consulted and patient for probable endoscopy tomorrow.  Follow H&H.  Transfusion threshold hemoglobin less than 8.  2.  Acute blood loss anemia Secondary to problem #1.  Check anemia panel.  Transfusion threshold hemoglobin less than 8.  3.  Hypokalemia Replete.  4.  Acute kidney injury Likely secondary to prerenal azotemia.  Check a fractional excretion of sodium.  Hold patient's diuretic and ACE inhibitor.  Hydrate with IV fluids.  Follow.  If no improvement check a renal ultrasound.  5.  Leukocytosis Likely reactive leukocytosis.  Check a UA with cultures and sensitivities.  Check a chest x-ray.  Follow for now.  No need for antibiotics at this time.  6.  Dehydration IV fluids.  7.  Hyperlipidemia Check a fasting lipid panel.  Hold statin for now.  8.  Diabetes mellitus type 2 Check a hemoglobin A1c.  Hold oral hypoglycemic agents.  Placed on a sliding scale insulin.  Follow.  9.  DVT prophylaxis: SCDs Code Status: Full Family Communication: Updated patient.  No family at bedside. Disposition Plan: Likely home once acute bleeding issue has been resolved. Consults called: [facility]: Dr. Dulce Sellarutlaw Admission status: Admit to inpatient.   Ramiro Harvestaniel Margarit Minshall MD [facility] Pager [phone]  If 7PM-7AM, please contact night-coverage [email or web address] Password [email or web address]  04/30/2017, 6:43 PM

## 2017-05-01 ENCOUNTER — Encounter (HOSPITAL_COMMUNITY): Payer: Self-pay | Admitting: *Deleted

## 2017-05-01 ENCOUNTER — Inpatient Hospital Stay (HOSPITAL_COMMUNITY): Payer: Medicare HMO | Admitting: Certified Registered Nurse Anesthetist

## 2017-05-01 ENCOUNTER — Encounter (HOSPITAL_COMMUNITY)
Admission: EM | Disposition: A | Discharge status: Home / Self Care | Payer: Self-pay | Source: Home / Self Care | Attending: Internal Medicine

## 2017-05-01 DIAGNOSIS — B3781 Candidal esophagitis: Secondary | ICD-10-CM

## 2017-05-01 HISTORY — PX: ESOPHAGOGASTRODUODENOSCOPY (EGD) WITH PROPOFOL: SHX5813

## 2017-05-01 HISTORY — DX: Candidal esophagitis: B37.81

## 2017-05-01 LAB — LIPID PANEL
CHOL/HDL RATIO: 3.5 ratio
CHOLESTEROL: 120 mg/dL (ref 0–200)
HDL: 34 mg/dL — ABNORMAL LOW (ref >40)
LDL CALC: 63 mg/dL (ref 0–99)
TRIGLYCERIDES: 114 mg/dL (ref <150)
VLDL: 23 mg/dL (ref 0–40)

## 2017-05-01 LAB — CBC
HCT: 23.7 % — ABNORMAL LOW (ref 39.0–52.0)
HEMATOCRIT: 25.4 % — AB (ref 39.0–52.0)
Hemoglobin: 8.8 g/dL — ABNORMAL LOW (ref 13.0–17.0)
Hemoglobin: 8.2 g/dL — ABNORMAL LOW (ref 13.0–17.0)
MCH: 33.1 pg (ref 26.0–34.0)
MCH: 33.2 pg (ref 26.0–34.0)
MCHC: 34.6 g/dL (ref 30.0–36.0)
MCHC: 34.6 g/dL (ref 30.0–36.0)
MCV: 95.5 fL (ref 78.0–100.0)
MCV: 96 fL (ref 78.0–100.0)
Platelets: 137 10*3/uL — ABNORMAL LOW (ref 150–400)
Platelets: 121 10*3/uL — ABNORMAL LOW (ref 150–400)
RBC: 2.66 MIL/uL — ABNORMAL LOW (ref 4.22–5.81)
RBC: 2.47 MIL/uL — ABNORMAL LOW (ref 4.22–5.81)
RDW: 12.9 % (ref 11.5–15.5)
RDW: 13 % (ref 11.5–15.5)
WBC: 9.7 10*3/uL (ref 4.0–10.5)
WBC: 7.7 10*3/uL (ref 4.0–10.5)

## 2017-05-01 LAB — COMPREHENSIVE METABOLIC PANEL
ALT: 13 U/L — ABNORMAL LOW (ref 17–63)
ANION GAP: 2 — AB (ref 5–15)
AST: 18 U/L (ref 15–41)
Albumin: 3 g/dL — ABNORMAL LOW (ref 3.5–5.0)
Alkaline Phosphatase: 37 U/L — ABNORMAL LOW (ref 38–126)
BILIRUBIN TOTAL: 0.6 mg/dL (ref 0.3–1.2)
BUN: 18 mg/dL (ref 6–20)
CO2: 25 mmol/L (ref 22–32)
Calcium: 8 mg/dL — ABNORMAL LOW (ref 8.9–10.3)
Chloride: 112 mmol/L — ABNORMAL HIGH (ref 101–111)
Creatinine, Ser: 1.04 mg/dL (ref 0.61–1.24)
GFR calc Af Amer: >60 mL/min (ref >60)
Glucose, Bld: 116 mg/dL — ABNORMAL HIGH (ref 65–99)
POTASSIUM: 3.8 mmol/L (ref 3.5–5.1)
Sodium: 139 mmol/L (ref 135–145)
TOTAL PROTEIN: 5.4 g/dL — AB (ref 6.5–8.1)

## 2017-05-01 LAB — GLUCOSE, CAPILLARY
GLUCOSE-CAPILLARY: 119 mg/dL — AB (ref 65–99)
GLUCOSE-CAPILLARY: 129 mg/dL — AB (ref 65–99)
Glucose-Capillary: 125 mg/dL — ABNORMAL HIGH (ref 65–99)
Glucose-Capillary: 130 mg/dL — ABNORMAL HIGH (ref 65–99)

## 2017-05-01 LAB — PROTIME-INR
INR: 1.15
Prothrombin Time: 14.6 seconds (ref 11.4–15.2)

## 2017-05-01 LAB — MAGNESIUM: MAGNESIUM: 2.4 mg/dL (ref 1.7–2.4)

## 2017-05-01 SURGERY — ESOPHAGOGASTRODUODENOSCOPY (EGD) WITH PROPOFOL
Anesthesia: Monitor Anesthesia Care | Laterality: Left

## 2017-05-01 MED ORDER — PEG-KCL-NACL-NASULF-NA ASC-C 100 G PO SOLR
0.5000 | Freq: Once | ORAL | Status: AC
  Administered 2017-05-01: 100 g via ORAL
  Filled 2017-05-01 (×2): qty 1

## 2017-05-01 MED ORDER — SODIUM CHLORIDE 0.9 % IV SOLN: INTRAVENOUS | Status: DC

## 2017-05-01 MED ORDER — LACTATED RINGERS IV SOLN
INTRAVENOUS | Status: DC | PRN
  Administered 2017-05-01: 09:00:00 via INTRAVENOUS

## 2017-05-01 MED ORDER — PROPOFOL 500 MG/50ML IV EMUL
INTRAVENOUS | Status: DC | PRN
  Administered 2017-05-01: 100 ug/kg/min via INTRAVENOUS

## 2017-05-01 MED ORDER — PROPOFOL 10 MG/ML IV BOLUS
INTRAVENOUS | Status: DC | PRN
  Administered 2017-05-01: 20 mg via INTRAVENOUS

## 2017-05-01 MED ORDER — LIDOCAINE 2% (20 MG/ML) 5 ML SYRINGE
INTRAMUSCULAR | Status: DC | PRN
  Administered 2017-05-01: 100 mg via INTRAVENOUS

## 2017-05-01 MED ORDER — PEG-KCL-NACL-NASULF-NA ASC-C 100 G PO SOLR
0.5000 | Freq: Once | ORAL | Status: AC
  Administered 2017-05-02: 100 g via ORAL

## 2017-05-01 MED ORDER — ONDANSETRON HCL 4 MG/2ML IJ SOLN
INTRAMUSCULAR | Status: DC | PRN
  Administered 2017-05-01: 4 mg via INTRAVENOUS

## 2017-05-01 MED ORDER — PEG-KCL-NACL-NASULF-NA ASC-C 100 G PO SOLR: 1.0000 | Freq: Once | ORAL | Status: DC

## 2017-05-01 MED ORDER — PROPOFOL 10 MG/ML IV BOLUS
INTRAVENOUS | Status: AC
  Filled 2017-05-01: qty 40

## 2017-05-01 SURGICAL SUPPLY — 14 items

## 2017-05-01 NOTE — Consult Note (Signed)
[facility] Consultation [facility]  Referring Provider: Dr. Ramiro Harvestaniel [facility] Primary Care Physician:  No primary care provider on file.  Reason for Consultation:  hematochezia  HPI: Thomas Rangel is a 76 y.o. male with one-day history of maroon stools.  No abdominal pain.  No hematemesis.  Remote dysphagia, now resolved.  Remote weight loss after death of spouse, now resolved.  No NSAIDs or anticoagulants.  No prior GI bleeding.  No prior endoscopy or colonoscopy.   Past Medical History:  Diagnosis Date  . Diabetes mellitus without complication (HCC)   . DM type 2 (diabetes mellitus, type 2) (HCC) 04/30/2017  . HTN (hypertension) 04/30/2017  . Hypertension     History reviewed. No pertinent surgical history.  Prior to Admission medications   Medication Sig Start Date End Date Taking? Authorizing Provider  atorvastatin (LIPITOR) 10 MG tablet Take 10 mg by mouth every morning.   Yes [provider]  diltiazem (CARDIZEM CD) 180 MG 24 hr capsule Take 180 mg by mouth daily. 04/26/17  Yes [provider]  glimepiride (AMARYL) 2 MG tablet Take 2 mg by mouth daily with breakfast.   Yes [provider]  hydrochlorothiazide (HYDRODIURIL) 25 MG tablet Take 25 mg by mouth daily.   Yes [provider]  lisinopril (PRINIVIL,ZESTRIL) 30 MG tablet Take 30 mg by mouth daily.   Yes [provider]  omega-3 acid ethyl esters (LOVAZA) 1 g capsule Take 2 g by mouth 2 (two) times daily.   Yes [provider]  pioglitazone (ACTOS) 30 MG tablet Take 30 mg by mouth daily.   Yes [provider]    Current Facility-Administered Medications  Medication Dose Route Frequency Provider Last Rate Last Dose  . 0.9 %  sodium chloride infusion   Intravenous Continuous Rodolph Bonghompson, Daniel V, MD 100 mL/hr at 04/30/17 2035    . 0.9 %  sodium chloride infusion   Intravenous Continuous Willis Modenautlaw, Grisel Blumenstock, MD      . Mitzi Hansen[MAR Hold] acetaminophen (TYLENOL) tablet  650 mg  650 mg Oral Q6H PRN Rodolph Bonghompson, Daniel V, MD       Or  . Mitzi Hansen[MAR Hold] acetaminophen (TYLENOL) suppository 650 mg  650 mg Rectal Q6H PRN Rodolph Bonghompson, Daniel V, MD      . Mitzi Hansen[MAR Hold] albuterol (PROVENTIL) (2.5 MG/3ML) 0.083% nebulizer solution 2.5 mg  2.5 mg Nebulization Q2H PRN Rodolph Bonghompson, Daniel V, MD      . Mitzi Hansen[MAR Hold] diltiazem (CARDIZEM CD) 24 hr capsule 120 mg  120 mg Oral Daily Rodolph Bonghompson, Daniel V, MD   120 mg at 04/30/17 1944  . [MAR Hold] hydrALAZINE (APRESOLINE) injection 10 mg  10 mg Intravenous Q6H PRN Rodolph Bonghompson, Daniel V, MD      . Mitzi Hansen[MAR Hold] insulin aspart (novoLOG) injection 0-9 Units  0-9 Units Subcutaneous TID WC Rodolph Bonghompson, Daniel V, MD      . Mitzi Hansen[MAR Hold] ondansetron [facility](ZOFRAN) tablet 4 mg  4 mg Oral Q6H PRN Rodolph Bonghompson, Daniel V, MD       Or  . Mitzi Hansen[MAR Hold] ondansetron [facility](ZOFRAN) injection 4 mg  4 mg Intravenous Q6H PRN Rodolph Bonghompson, Daniel V, MD      . Mitzi Hansen[MAR Hold] oxyCODONE (Oxy IR/ROXICODONE) immediate release tablet 5 mg  5 mg Oral Q4H PRN Rodolph Bonghompson, Daniel V, MD      . Mitzi Hansen[MAR Hold] pantoprazole (PROTONIX) injection 40 mg  40 mg Intravenous Q12H Rodolph Bonghompson, Daniel V, MD   40 mg at 04/30/17 2225  . [MAR Hold] senna-docusate (Senokot-S) tablet 1 tablet  1  tablet Oral QHS PRN Rodolph Bong, MD      . Mitzi Hansen Hold] sorbitol 70 % solution 30 mL  30 mL Oral Daily PRN Rodolph Bong, MD        Allergies as of 04/30/2017 - Review Complete 04/30/2017  Allergen Reaction Noted  . Simvastatin  09/14/2015  . Metformin Nausea Only 09/14/2015  . Penicillins Rash 04/08/2014    Family History  Problem Relation Age of Onset  . CVA Mother   . CVA Father     Social History   Socioeconomic History  . Marital status: Single    Spouse name: Not on file  . Number of children: Not on file  . Years of education: Not on file  . Highest education level: Not on file  Social Needs  . Financial resource strain: Not on file  . Food insecurity - worry: Not on file  . Food insecurity - inability: Not on file   . Transportation needs - medical: Not on file  . Transportation needs - non-medical: Not on file  Occupational History  . Not on file  Tobacco Use  . Smoking status: Never Smoker  . Smokeless tobacco: Never Used  Substance and Sexual Activity  . Alcohol use: No  . Drug use: No  . Sexual activity: Not on file  Other Topics Concern  . Not on file  Social History Narrative  . Not on file    Review of Systems: As per HPI, all others negative  Physical Exam: Vital signs in last 24 hours: Temp:  [98.1 F (36.7 C)-98.5 F (36.9 C)] 98.1 F (36.7 C) (12/05 0850) Pulse Rate:  [71-103] 73 (12/05 0850) Resp:  [0-31] 16 (12/05 0850) BP: (109-158)/(57-77) 158/65 (12/05 0850) SpO2:  [97 %-100 %] 100 % (12/05 0850) Weight:  [193 lb 12.6 oz (87.9 kg)-200 lb 2.8 oz (90.8 kg)] 200 lb 2.8 oz (90.8 kg) (12/05 0850) Last BM Date: 04/30/17 General:   Alert, much younger-appearing than stated age, Well-developed, well-nourished, pleasant and cooperative in NAD Head:  Normocephalic and atraumatic. Eyes:  Sclera clear, no icterus.   Conjunctiva pink. Ears:  Normal auditory acuity. Nose:  No deformity, discharge,  or lesions. Mouth:  No deformity or lesions.  Oropharynx dry Neck:  Supple; no masses or thyromegaly. Lungs:  Clear throughout to auscultation.   No wheezes, crackles, or rhonchi. No acute distress. Heart:  Regular rate and rhythm; no murmurs, clicks, rubs,  or gallops. Abdomen:  Soft, protuberant, nontender and nondistended. No masses, hepatosplenomegaly or hernias noted. Normal bowel sounds, without guarding, and without rebound.     Msk:  Symmetrical without gross deformities. Normal posture. Pulses:  Normal pulses noted. Extremities:  Without clubbing or edema. Neurologic:  Alert and  oriented x4; diffusely weak, otherwise grossly normal neurologically. Skin:  Intact without significant lesions or rashes. Psych:  Alert and cooperative. Normal mood and affect.   Lab  Results: Recent Labs    04/30/17 1445 04/30/17 1855 05/01/17 0214  WBC 12.0* 10.6* 9.7  HGB 9.1* 9.1* 8.8*  HCT 26.8* 26.5* 25.4*  PLT 148* 147* 137*   BMET Recent Labs    04/30/17 0810 04/30/17 1856 05/01/17 0214  NA 135 139 139  K 3.4* 3.6 3.8  CL 103 109 112*  CO2 23 25 25   GLUCOSE 311* 157* 116*  BUN 26* 22* 18  CREATININE 1.59* 1.11 1.04  CALCIUM 8.6* 7.9* 8.0*   LFT Recent Labs    04/30/17 1855 05/01/17 0214  PROT  5.5* 5.4*  ALBUMIN 3.1* 3.0*  AST 20 18  ALT 13* 13*  ALKPHOS 39 37*  BILITOT 0.6 0.6  BILIDIR <0.1*  --   IBILI NOT CALCULATED  --    PT/INR Recent Labs    05/01/17 0214  LABPROT 14.6  INR 1.15    Studies/Results: X-ray Chest Pa And Lateral  Result Date: 04/30/2017 CLINICAL DATA:  76 year old male with leukocytosis. EXAM: CHEST  2 VIEW COMPARISON:  None. FINDINGS: A 5 mm nodular appearing density over the right posterior eighth rib may be a vessel on end or represent a pulmonary nodule. CT of the chest on nonemergent basis may provide better evaluation. The lungs are otherwise clear. There is no pleural effusion or pneumothorax. The cardiac silhouette is within normal limits. Mild atherosclerotic calcification of the aortic arch. No acute osseous pathology. IMPRESSION: No active cardiopulmonary disease. Electronically Signed   By: Elgie CollardArash  Radparvar M.D.   On: 04/30/2017 22:01    Impression:  1.  Maroon stools, slightly elevated BUN, upper vs lower GI bleeding. 2.  Acute blood loss anemia.  Plan:  1.  PPI. 2.  Volume repletion. 3.  Serial CBCs. 4.  PPI. 5.  EGD today; if negative would consider colonoscopy tomorrow. 6.  Risks (bleeding, infection, bowel perforation that could require surgery, sedation-related changes in cardiopulmonary systems), benefits (identification and possible treatment of source of symptoms, exclusion of certain causes of symptoms), and alternatives (watchful waiting, radiographic imaging studies, empiric medical  treatment) of upper endoscopy (EGD) were explained to patient/family in detail and patient wishes to proceed.   LOS: 1 day   Yatzary Merriweather M  05/01/2017, 9:30 AM  Cell [phone] If no answer or after 5 PM call [phone]

## 2017-05-01 NOTE — Evaluation (Signed)
Physical Therapy Evaluation & discharge Patient Details Name: Thomas Rangel Risk MRN: [id number] DOB: 01/04/1941 Today's Date: 05/01/2017   History of Present Illness  Patient is a pleasant 76 year old gentleman history of hypertension, well-controlled diabetes mellitus, hyperlipidemia, gastroesophageal reflux disease, history of remote peptic ulcer disease presented to the ED with sudden onset bright red blood per rectum/maroon stools.  Clinical Impression  Pt is at independent level with mobility.  PT okayed him to walk in hallway, but that if he felt weak due to current liquid diet to have someone else with him.  Will sign off from PT at this time.  If pt's status changes and he needs PT assessment again, please re-order.     Follow Up Recommendations No PT follow up    Equipment Recommendations  None recommended by PT    Recommendations for Other Services       Precautions / Restrictions Precautions Precautions: None Restrictions Weight Bearing Restrictions: No      Mobility  Bed Mobility Overal bed mobility: Independent                Transfers Overall transfer level: Modified independent Equipment used: None                Ambulation/Gait Ambulation/Gait assistance: Independent Ambulation Distance (Feet): 700 Feet Assistive device: None Gait Pattern/deviations: Step-through pattern     General Gait Details: Pt ambulated in room with IV pole including backwards steps into bathroom.  In hallway ambulated without AD with no difficulty.  Incorporated aspects of the Dynamic Gait Index and pt with no difficulty with head turns, head nods, quick stops, turns and speed changes.  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     Hand Dominance        Extremity/Trunk Assessment        Lower Extremity Assessment Lower Extremity Assessment: Overall WFL for tasks assessed       Communication   Communication: No difficulties  Cognition Arousal/Alertness: Awake/alert Behavior During Therapy: WFL for tasks assessed/performed Overall Cognitive Status: Within Functional Limits for tasks assessed                                        General Comments      Exercises     Assessment/Plan    PT Assessment Patent does not need any further PT services  PT Problem List         PT Treatment Interventions      PT Goals (Current goals can be found in the Care Plan section)  Acute Rehab PT Goals  Patient Stated Goal: Return to work PT Goal Formulation: All assessment and education complete, DC therapy    Frequency     Barriers to discharge        Co-evaluation               AM-PAC PT "6 Clicks" Daily Activity  Outcome Measure Difficulty turning over in bed (including adjusting bedclothes, sheets and blankets)?: None Difficulty moving from lying on back to sitting on the side of the bed? : None Difficulty sitting down on and standing up from a chair with arms (e.g., wheelchair, bedside commode, etc,.)?: None Help needed moving to and from a bed to chair (including a wheelchair)?: None Help needed walking in hospital room?: None Help needed climbing 3-5 steps with a railing? : None 6 Click Score: 24    End of Session   Activity Tolerance: Patient tolerated treatment well Patient  left: in chair;Other (comment)(OT ) Nurse Communication: Mobility status PT Visit Diagnosis: Other abnormalities of gait and mobility (R26.89)    Time: [id number] PT Time Calculation (min) (ACUTE ONLY): 20 min   Charges:   PT Evaluation $PT Eval Low Complexity: 1 Low     PT G Codes:        Thomas Rangel, [place] Pager [phone] 05/01/2017   Thomas Rangel 05/01/2017, 2:16 PM

## 2017-05-01 NOTE — Transfer of Care (Signed)
Immediate Anesthesia Transfer of Care Note  Patient: Thomas Rangel  Procedure(s) Performed: ESOPHAGOGASTRODUODENOSCOPY (EGD) WITH PROPOFOL (Left )  Patient Location: PACU  Anesthesia Type:MAC  Level of Consciousness: awake, alert , oriented and patient cooperative  Airway & Oxygen Therapy: Patient Spontanous Breathing and on nasal cannula.  Post-op Assessment: Report given to RN, Post -op Vital signs reviewed and stable and Patient moving all extremities X 4  Post vital signs: Reviewed and stable  Last Vitals:  Vitals:   05/01/17 0442 05/01/17 0850  BP: (!) 116/57 (!) 158/65  Pulse: 71 73  Resp: 14 16  Temp: 36.7 C 36.7 C  SpO2: 100% 100%    Last Pain:  Vitals:   05/01/17 0850  TempSrc: Oral         Complications: No apparent anesthesia complications

## 2017-05-01 NOTE — Progress Notes (Signed)
PT note:  Pt off the unit for procedure.  Will check back as able.  Clydie BraunKaren L. Katrinka BlazingSmith, [place] Pager [phone] 05/01/2017

## 2017-05-01 NOTE — Op Note (Signed)
[facility] Patient Name: Thomas Rangel Procedure Date: 05/01/2017 MRN: [id number] Attending MD: Willis Modena , MD Date of Birth: June 09, 1940 CSN: [id number] Age: 76 Admit Type: Inpatient Procedure:                Upper GI endoscopy Indications:              Iron deficiency anemia secondary to chronic blood                            loss, Hematochezia, Melena Providers:                Willis Modena, MD, Omelia Blackwater RN, RN, Zoila Shutter, Technician, Kym Groom, CRNA Referring MD:              Medicines:                Monitored Anesthesia Care Complications:            No immediate complications. Estimated Blood Loss:     Estimated blood loss: none. Procedure:                Pre-Anesthesia Assessment:                           - Prior to the procedure, a History and Physical                            was performed, and patient medications and                            allergies were reviewed. The patient's tolerance of                            previous anesthesia was also reviewed. The risks                            and benefits of the procedure and the sedation                            options and risks were discussed with the patient.                            All questions were answered, and informed consent                            was obtained. Prior Anticoagulants: The patient has                            taken aspirin. ASA Grade Assessment: III - A                            patient with severe systemic disease. After  reviewing the risks and benefits, the patient was                            deemed in satisfactory condition to undergo the                            procedure.                           After obtaining informed consent, the endoscope was                            passed under direct vision. Throughout the                            procedure, the patient's blood  pressure, pulse, and                            oxygen saturations were monitored continuously. The                            [id number] ([id number]) scope was introduced through the                            mouth, and advanced to the second part of duodenum.                            The upper GI endoscopy was accomplished without                            difficulty. The patient tolerated the procedure                            well. Scope In: Scope Out: Findings:      LA Grade A (one or more mucosal breaks less than 5 mm, not extending       between tops of 2 mucosal folds) esophagitis was found.      One mild benign-appearing, intrinsic stenosis was found.      A small hiatal hernia was present.      Patchy candidiasis was found in the entire esophagus.      The exam of the esophagus was otherwise normal.      The entire examined stomach was normal.      The duodenal bulb, first portion of the duodenum and second portion of       the duodenum were normal.      No old or fresh blood was seen to the extent of our examination. No       source of GI bleeding was identified on endoscopy. Impression:               - LA Grade A esophagitis.                           - Benign-appearing esophageal stenosis.                           -  Small hiatal hernia.                           - Monilial esophagitis.                           - Normal stomach.                           - Normal duodenal bulb, first portion of the                            duodenum and second portion of the duodenum. Moderate Sedation:      None Recommendation:           - Perform a colonoscopy tomorrow.                           - Treat esophageal candidiasis and esophagitis                            after work-up of his GI bleeding.                           - Will need repeat endoscopy in 3 months to                            reassess distal esophagus, after PPI therapy.                           Deboraha Sprang- [facility]  will follow.                           - Clear liquid diet today.                           - Continue present medications. Procedure Code(s):        --- Professional ---                           [phone], Esophagogastroduodenoscopy, flexible,                            transoral; diagnostic, including collection of                            specimen(s) by brushing or washing, when performed                            (separate procedure) Diagnosis Code(s):        --- Professional ---                           K20.9, Esophagitis, unspecified                           K22.2, Esophageal obstruction  K44.9, Diaphragmatic hernia without obstruction or                            gangrene                           B37.81, Candidal esophagitis                           D50.0, Iron deficiency anemia secondary to blood                            loss (chronic)                           K92.1, Melena (includes Hematochezia) CPT copyright 2016 [employer]. All rights reserved. The codes documented in this report are preliminary and upon coder review may  be revised to meet current compliance requirements. Willis Modena, MD 05/01/2017 9:58:09 AM This report has been signed electronically. Number of Addenda: 0

## 2017-05-01 NOTE — Anesthesia Preprocedure Evaluation (Addendum)
Anesthesia Evaluation  Patient identified by MRN, date of birth, ID band Patient awake    Reviewed: Allergy & Precautions, NPO status , Patient's Chart, lab work & pertinent test results  Airway Mallampati: II  TM Distance: >3 FB Neck ROM: Full    Dental   Pulmonary neg pulmonary ROS,    breath sounds clear to auscultation       Cardiovascular hypertension, Pt. on medications  Rhythm:Regular Rate:Normal     Neuro/Psych negative neurological ROS     GI/Hepatic Neg liver ROS, GI bleed   Endo/Other  diabetes, Type 2  Renal/GU Renal disease     Musculoskeletal   Abdominal   Peds  Hematology  (+) anemia ,   Anesthesia Other Findings   Reproductive/Obstetrics                             Anesthesia Physical Anesthesia Plan  ASA: III  Anesthesia Plan: MAC   Post-op Pain Management:    Induction: Intravenous  PONV Risk Score and Plan: 1 and Ondansetron, Propofol infusion and Treatment may vary due to age or medical condition  Airway Management Planned: Natural Airway and Nasal Cannula  Additional Equipment:   Intra-op Plan:   Post-operative Plan:   Informed Consent: I have reviewed the patients History and Physical, chart, labs and discussed the procedure including the risks, benefits and alternatives for the proposed anesthesia with the patient or authorized representative who has indicated his/her understanding and acceptance.     Plan Discussed with: CRNA  Anesthesia Plan Comments:        Anesthesia Quick Evaluation

## 2017-05-01 NOTE — Anesthesia Procedure Notes (Signed)
Procedure Name: MAC Date/Time: 05/01/2017 9:39 AM Performed by: West Pugh, CRNA Pre-anesthesia Checklist: Patient identified, Emergency Drugs available, Suction available, Patient being monitored and Timeout performed Patient Re-evaluated:Patient Re-evaluated prior to induction Oxygen Delivery Method: Nasal cannula Induction Type: IV induction Placement Confirmation: positive ETCO2 Dental Injury: Teeth and Oropharynx as per pre-operative assessment

## 2017-05-01 NOTE — Progress Notes (Signed)
PROGRESS NOTE    Thomas Rangel  IRS:[id number] DOB: 04/12/1941 DOA: 04/30/2017 PCP: No primary care provider on file.   Brief Narrative:  Patient is a pleasant 76 year old gentleman history of hypertension, well-controlled diabetes mellitus, hyperlipidemia, gastroesophageal reflux disease, history of remote peptic ulcer disease presented to the ED with sudden onset bright red blood per rectum/maroon stools.  Patient had a total of 8 bloody bowel movements.  Patient placed on IV PPI.  GI consultation pending.  Patient for probable endoscopy today.   Assessment & Plan:   Principal Problem:   GIB (gastrointestinal bleeding) Active Problems:   Acute blood loss anemia   Hypokalemia   AKI (acute kidney injury) (HCC)   Dehydration   HTN (hypertension)   Leukocytosis   DM type 2 (diabetes mellitus, type 2) (HCC)  #1 GI bleed Questionable etiology.  Patient had presented with multiple episodes of bloody bowel movements which are maroon colored.  Patient had a total of about 8 bloody bowel movements yesterday.  No further bloody bowel movements.  Hemoglobin currently at 8.8 from 11.2 on admission.  Patient denies any abdominal pain.  Continue Protonix 40 mg IV every 12 hours.  Patient currently n.p.o.  Continue IV fluids.  Supportive care.  GI consulted and will assess the patient this morning and patient for probable endoscopy.  Follow.  2.  Acute blood loss anemia Secondary to problem #1.  Hemoglobin currently at 8.8 from 11.2 on admission.  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  Transfusion threshold hemoglobin less than 8.  Follow.  3.  Dehydration IV fluids.  4.  Acute kidney injury Likely prerenal azotemia secondary to problem #1.  Improved with hydration.  Continue to hold ACE inhibitor and diuretic.  5.  Hypertension Blood pressure stable.  Continue to hold ACE inhibitor and diuretic secondary to problems #1, 3, 4.  6.  Leukocytosis Likely a reactive  leukocytosis.  Chest x-ray was negative.  Urinalysis was unremarkable.  The patient is afebrile.  Leukocytosis trended down.  No need for antibiotics.  7.  Well controlled diabetes mellitus type 2 Hemoglobin A1c 6.6.  CBGs have ranged from 119 -129.  Oral hypoglycemic agents on hold.  Continue sliding scale insulin.  8.  Hypokalemia/hypomagnesemia Repleted.    DVT prophylaxis: SCDs Code Status: Full Family Communication: Updated patient and family at bedside. Disposition Plan: Likely home once workup completed medically stable no further bleeding and per GI.   Consultants:   Gastroenterology pending  Procedures:   Chest x-ray 04/30/2017  Antimicrobials:   None   Subjective: Laying in bed.  Denies any chest pain.  No shortness of breath.  Denies any further bloody bowel movements.  Objective: Vitals:   04/30/17 1726 04/30/17 2225 05/01/17 0442 05/01/17 0443  BP: (!) 154/77 137/76 (!) 116/57   Pulse: (!) 102 83 71   Resp: 12 18 14    Temp: 98.5 F (36.9 C) 98.4 F (36.9 C) 98.1 F (36.7 C)   TempSrc: Oral Oral Oral   SpO2: 100% 100% 100%   Weight: 87.9 kg (193 lb 12.6 oz)   90.8 kg (200 lb 2.8 oz)  Height: 5\' 10"  (1.778 m)       Intake/Output Summary (Last 24 hours) at 05/01/2017 0815 Last data filed at 05/01/2017 0600 Gross per 24 hour  Intake 941.67 ml  Output 250 ml  Net 691.67 ml   Filed Weights   04/30/17 0750 04/30/17 1726 05/01/17 0443  Weight: 90.7 kg (200 lb) 87.9 kg (193  lb 12.6 oz) 90.8 kg (200 lb 2.8 oz)    Examination:  General exam: Appears calm and comfortable  Respiratory system: Clear to auscultation. Respiratory effort normal. Cardiovascular system: S1 & S2 heard, RRR. No JVD, murmurs, rubs, gallops or clicks. No pedal edema. Gastrointestinal system: Abdomen is nondistended, soft and nontender. No organomegaly or masses felt. Normal bowel sounds heard. Central nervous system: Alert and oriented. No focal neurological  deficits. Extremities: Symmetric 5 x 5 power. Skin: No rashes, lesions or ulcers Psychiatry: Judgement and insight appear normal. Mood & affect appropriate.     Data Reviewed: I have personally reviewed following labs and imaging studies  CBC: Recent Labs  Lab 04/30/17 0810 04/30/17 1445 04/30/17 1855 05/01/17 0214  WBC 15.6* 12.0* 10.6* 9.7  NEUTROABS 11.2*  --   --   --   HGB 11.2* 9.1* 9.1* 8.8*  HCT 33.0* 26.8* 26.5* 25.4*  MCV 95.9 96.4 95.0 95.5  PLT 178 148* 147* 137*   Basic Metabolic Panel: Recent Labs  Lab 04/30/17 0810 04/30/17 1855 04/30/17 1856 05/01/17 0214  NA 135  --  139 139  K 3.4*  --  3.6 3.8  CL 103  --  109 112*  CO2 23  --  25 25  GLUCOSE 311*  --  157* 116*  BUN 26*  --  22* 18  CREATININE 1.59*  --  1.11 1.04  CALCIUM 8.6*  --  7.9* 8.0*  MG  --  1.5*  --  2.4   GFR: Estimated Creatinine Clearance: 69.5 mL/min (by C-G formula based on SCr of 1.04 mg/dL). Liver Function Tests: Recent Labs  Lab 04/30/17 1855 05/01/17 0214  AST 20 18  ALT 13* 13*  ALKPHOS 39 37*  BILITOT 0.6 0.6  PROT 5.5* 5.4*  ALBUMIN 3.1* 3.0*   No results for input(s): LIPASE, AMYLASE in the last 168 hours. No results for input(s): AMMONIA in the last 168 hours. Coagulation Profile: Recent Labs  Lab 05/01/17 0214  INR 1.15   Cardiac Enzymes: No results for input(s): CKTOTAL, CKMB, CKMBINDEX, TROPONINI in the last 168 hours. BNP (last 3 results) No results for input(s): PROBNP in the last 8760 hours. HbA1C: Recent Labs    04/30/17 1853  HGBA1C 6.6*   CBG: Recent Labs  Lab 04/30/17 1307 05/01/17 0009 05/01/17 0609  GLUCAP 162* 129* 119*   Lipid Profile: Recent Labs    05/01/17 0214  CHOL 120  HDL 34*  LDLCALC 63  TRIG [id number]  CHOLHDL 3.5   Thyroid Function Tests: No results for input(s): TSH, T4TOTAL, FREET4, T3FREE, THYROIDAB in the last 72 hours. Anemia Panel: Recent Labs    04/30/17 1855  VITAMINB12 878  FOLATE 27.0  FERRITIN 127   TIBC 248*  IRON 56  RETICCTPCT 2.0   Sepsis Labs: No results for input(s): PROCALCITON, LATICACIDVEN in the last 168 hours.  No results found for this or any previous visit (from the past 240 hour(s)).       Radiology Studies: X-ray Chest Pa And Lateral  Result Date: 04/30/2017 CLINICAL DATA:  76 year old male with leukocytosis. EXAM: CHEST  2 VIEW COMPARISON:  None. FINDINGS: A 5 mm nodular appearing density over the right posterior eighth rib may be a vessel on end or represent a pulmonary nodule. CT of the chest on nonemergent basis may provide better evaluation. The lungs are otherwise clear. There is no pleural effusion or pneumothorax. The cardiac silhouette is within normal limits. Mild atherosclerotic calcification of the aortic arch.  No acute osseous pathology. IMPRESSION: No active cardiopulmonary disease. Electronically Signed   By: Elgie Collard M.D.   On: 04/30/2017 22:01        Scheduled Meds: . diltiazem  120 mg Oral Daily  . insulin aspart  0-9 Units Subcutaneous TID WC  . pantoprazole (PROTONIX) IV  40 mg Intravenous Q12H   Continuous Infusions: . sodium chloride 100 mL/hr at 04/30/17 2035     LOS: 1 day    Time spent: 40 mins    Ramiro Harvest, MD [facility] Pager [phone]  If 7PM-7AM, please contact night-coverage [email or web address] Password [email or web address] 05/01/2017, 8:15 AM

## 2017-05-01 NOTE — Interval H&P Note (Signed)
History and Physical Interval Note:  05/01/2017 9:30 AM  Thomas Rangel  has presented today for surgery, with the diagnosis of melena, anemia  The various methods of treatment have been discussed with the patient and family. After consideration of risks, benefits and other options for treatment, the patient has consented to  Procedure(s): ESOPHAGOGASTRODUODENOSCOPY (EGD) WITH PROPOFOL (Left) as a surgical intervention .  The patient's history has been reviewed, patient examined, no change in status, stable for surgery.  I have reviewed the patient's chart and labs.  Questions were answered to the patient's satisfaction.     Freddy JakschUTLAW,Linsey Hirota M

## 2017-05-01 NOTE — Progress Notes (Signed)
OT Cancellation Note  Patient Details Name: Thomas Rangel MRN: [id number] DOB: 10/29/1940   Cancelled Treatment:    Reason Eval/Treat Not Completed: Patient at procedure or test/ unavailable. Off unit for procedure. Will check back as able to initiate OT evaluation. Thank you for this referral!  Doristine Sectionharity A Analiese Krupka, MS OTR/L  Pager: [phone]   [facility] HospitalCharity A Anderia Lorenzo 05/01/2017, 9:35 AM

## 2017-05-01 NOTE — Anesthesia Postprocedure Evaluation (Signed)
Anesthesia Post Note  Patient: Thomas Rangel  Procedure(s) Performed: ESOPHAGOGASTRODUODENOSCOPY (EGD) WITH PROPOFOL (Left )     Patient location during evaluation: PACU Anesthesia Type: MAC Level of consciousness: awake and alert Pain management: pain level controlled Vital Signs Assessment: post-procedure vital signs reviewed and stable Respiratory status: spontaneous breathing, nonlabored ventilation, respiratory function stable and patient connected to nasal cannula oxygen Cardiovascular status: stable and blood pressure returned to baseline Postop Assessment: no apparent nausea or vomiting Anesthetic complications: no    Last Vitals:  Vitals:   05/01/17 1005 05/01/17 1010  BP:  132/71  Pulse:  68  Resp:  14  Temp:    SpO2: 100% 100%    Last Pain:  Vitals:   05/01/17 0850  TempSrc: Oral                 Tiajuana Amass

## 2017-05-01 NOTE — Evaluation (Signed)
Occupational Therapy Evaluation and Discharge Patient Details Name: Thomas Rangel MRN: [id number] DOB: 03/20/1941 Today's Date: 05/01/2017    History of Present Illness Patient is a pleasant 76 year old gentleman history of hypertension, well-controlled diabetes mellitus, hyperlipidemia, gastroesophageal reflux disease, history of remote peptic ulcer disease presented to the ED with sudden onset bright red blood per rectum/maroon stools.   Clinical Impression   Patient evaluated by Occupational Therapy with no further acute OT needs identified. He is able to complete all ADL and ADL transfers independently. Educated pt concerning safety, energy conservation, and activity progression to improve safety post-acute D/C and he demonstrates good understanding. All education has been completed and the patient has no further questions. See below for any follow-up Occupational Therapy or equipment needs. OT to sign off. Thank you for referral.      Follow Up Recommendations  No OT follow up;Supervision - Intermittent    Equipment Recommendations  None recommended by OT    Recommendations for Other Services       Precautions / Restrictions Precautions Precautions: None Restrictions Weight Bearing Restrictions: No      Mobility Bed Mobility Overal bed mobility: Independent                Transfers Overall transfer level: Independent Equipment used: None                  Balance Overall balance assessment: Independent                                         ADL either performed or assessed with clinical judgement   ADL Overall ADL's : Independent                                             Vision Patient Visual Report: No change from baseline Vision Assessment?: No apparent visual deficits     Perception     Praxis      Pertinent Vitals/Pain Pain Assessment: No/denies pain     Hand Dominance     Extremity/Trunk  Assessment     Lower Extremity Assessment Lower Extremity Assessment: Overall WFL for tasks assessed       Communication Communication Communication: No difficulties   Cognition Arousal/Alertness: Awake/alert Behavior During Therapy: WFL for tasks assessed/performed Overall Cognitive Status: Within Functional Limits for tasks assessed                                     General Comments       Exercises     Shoulder Instructions      Home Living Family/patient expects to be discharged to:: Private residence Living Arrangements: Children;Other relatives Available Help at Discharge: Family Type of Home: House Home Access: Stairs to enter   Entrance Stairs-Rails: None Home Layout: One level     Bathroom Shower/Tub: Chief Strategy OfficerTub/shower unit   Bathroom Toilet: (comfort height)     Home Equipment: None          Prior Functioning/Environment Level of Independence: Independent        Comments: 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        OT Problem List:        OT Treatment/Interventions:  OT Goals(Current goals can be found in the care plan section) Acute Rehab OT Goals Patient Stated Goal: Return to work OT Goal Formulation: With patient  OT Frequency:     Barriers to D/C:            Co-evaluation              AM-PAC PT "6 Clicks" Daily Activity     Outcome Measure Help from another person eating meals?: None Help from another person taking care of personal grooming?: None Help from another person toileting, which includes using toliet, bedpan, or urinal?: None Help from another person bathing (including washing, rinsing, drying)?: None Help from another person to put on and taking off regular upper body clothing?: None Help from another person to put on and taking off regular lower body clothing?: None 6 Click Score: 24   End of Session Nurse Communication: Mobility status  Activity Tolerance: Patient tolerated treatment  well Patient left: in chair;with call bell/phone within reach;with family/visitor present  OT Visit Diagnosis: Other abnormalities of gait and mobility (R26.89)                Time: 1350-1400 OT Time Calculation (min): 10 min Charges:  OT General Charges $OT Visit: 1 Visit OT Evaluation $OT Eval Low Complexity: 1 Low G-Codes:     Doristine Sectionharity A Nyeemah Jennette, MS OTR/L  Pager: [phone]   Dacota Ruben A Cammie Faulstich 05/01/2017, 3:55 PM

## 2017-05-02 ENCOUNTER — Inpatient Hospital Stay (HOSPITAL_COMMUNITY): Payer: Medicare HMO | Admitting: Anesthesiology

## 2017-05-02 ENCOUNTER — Encounter (HOSPITAL_COMMUNITY)
Admission: EM | Disposition: A | Discharge status: Home / Self Care | Payer: Self-pay | Source: Home / Self Care | Attending: Internal Medicine

## 2017-05-02 ENCOUNTER — Encounter (HOSPITAL_COMMUNITY): Payer: Self-pay | Admitting: *Deleted

## 2017-05-02 DIAGNOSIS — K5731 Diverticulosis of large intestine without perforation or abscess with bleeding: Secondary | ICD-10-CM

## 2017-05-02 DIAGNOSIS — K222 Esophageal obstruction: Secondary | ICD-10-CM

## 2017-05-02 DIAGNOSIS — K208 Other esophagitis without bleeding: Secondary | ICD-10-CM

## 2017-05-02 DIAGNOSIS — B3781 Candidal esophagitis: Secondary | ICD-10-CM

## 2017-05-02 DIAGNOSIS — K5790 Diverticulosis of intestine, part unspecified, without perforation or abscess without bleeding: Secondary | ICD-10-CM

## 2017-05-02 HISTORY — PX: COLONOSCOPY WITH PROPOFOL: SHX5780

## 2017-05-02 HISTORY — DX: Diverticulosis of intestine, part unspecified, without perforation or abscess without bleeding: K57.90

## 2017-05-02 HISTORY — DX: Diverticulosis of large intestine without perforation or abscess with bleeding: K57.31

## 2017-05-02 LAB — BASIC METABOLIC PANEL
ANION GAP: 2 — AB (ref 5–15)
BUN: 10 mg/dL (ref 6–20)
CHLORIDE: 115 mmol/L — AB (ref 101–111)
CO2: 23 mmol/L (ref 22–32)
CREATININE: 1.04 mg/dL (ref 0.61–1.24)
Calcium: 7.8 mg/dL — ABNORMAL LOW (ref 8.9–10.3)
GFR calc non Af Amer: >60 mL/min (ref >60)
Glucose, Bld: 112 mg/dL — ABNORMAL HIGH (ref 65–99)
Potassium: 3.7 mmol/L (ref 3.5–5.1)
SODIUM: 140 mmol/L (ref 135–145)

## 2017-05-02 LAB — CBC
HCT: 22.6 % — ABNORMAL LOW (ref 39.0–52.0)
HEMATOCRIT: 22.1 % — AB (ref 39.0–52.0)
HEMOGLOBIN: 7.7 g/dL — AB (ref 13.0–17.0)
HEMOGLOBIN: 7.8 g/dL — AB (ref 13.0–17.0)
MCH: 33.3 pg (ref 26.0–34.0)
MCH: 33.1 pg (ref 26.0–34.0)
MCHC: 34.8 g/dL (ref 30.0–36.0)
MCHC: 34.5 g/dL (ref 30.0–36.0)
MCV: 95.7 fL (ref 78.0–100.0)
MCV: 95.8 fL (ref 78.0–100.0)
PLATELETS: 121 10*3/uL — AB (ref 150–400)
Platelets: 119 10*3/uL — ABNORMAL LOW (ref 150–400)
RBC: 2.31 MIL/uL — AB (ref 4.22–5.81)
RBC: 2.36 MIL/uL — AB (ref 4.22–5.81)
RDW: 12.9 % (ref 11.5–15.5)
RDW: 12.9 % (ref 11.5–15.5)
WBC: 7.9 10*3/uL (ref 4.0–10.5)
WBC: 7.8 10*3/uL (ref 4.0–10.5)

## 2017-05-02 LAB — URINE CULTURE: Culture: NO GROWTH

## 2017-05-02 LAB — GLUCOSE, CAPILLARY
Glucose-Capillary: 108 mg/dL — ABNORMAL HIGH (ref 65–99)
Glucose-Capillary: 130 mg/dL — ABNORMAL HIGH (ref 65–99)

## 2017-05-02 LAB — PREPARE RBC (CROSSMATCH)

## 2017-05-02 LAB — HIV ANTIBODY (ROUTINE TESTING W REFLEX): HIV SCREEN 4TH GENERATION: NONREACTIVE

## 2017-05-02 LAB — ABO/RH: ABO/RH(D): O POS

## 2017-05-02 SURGERY — COLONOSCOPY WITH PROPOFOL
Anesthesia: Monitor Anesthesia Care

## 2017-05-02 MED ORDER — FLUCONAZOLE IN SODIUM CHLORIDE 200-0.9 MG/100ML-% IV SOLN
200.0000 mg | Freq: Once | INTRAVENOUS | Status: AC
  Administered 2017-05-02: 200 mg via INTRAVENOUS
  Filled 2017-05-02: qty 100

## 2017-05-02 MED ORDER — PROPOFOL 10 MG/ML IV BOLUS
INTRAVENOUS | Status: AC
  Filled 2017-05-02: qty 40

## 2017-05-02 MED ORDER — FUROSEMIDE 10 MG/ML IJ SOLN
20.0000 mg | Freq: Once | INTRAMUSCULAR | Status: AC
  Administered 2017-05-02: 20 mg via INTRAVENOUS

## 2017-05-02 MED ORDER — LACTATED RINGERS IV SOLN
INTRAVENOUS | Status: DC
  Administered 2017-05-02: 1000 mL via INTRAVENOUS

## 2017-05-02 MED ORDER — ACETAMINOPHEN 325 MG PO TABS
650.0000 mg | ORAL_TABLET | Freq: Once | ORAL | Status: AC
  Administered 2017-05-02: 650 mg via ORAL
  Filled 2017-05-02: qty 2

## 2017-05-02 MED ORDER — PROPOFOL 10 MG/ML IV BOLUS
INTRAVENOUS | Status: DC | PRN
  Administered 2017-05-02: 20 mg via INTRAVENOUS

## 2017-05-02 MED ORDER — SODIUM CHLORIDE 0.9 % IV SOLN
Freq: Once | INTRAVENOUS | Status: AC
  Administered 2017-05-02: 19:00:00 via INTRAVENOUS

## 2017-05-02 MED ORDER — FLEET ENEMA 7-19 GM/118ML RE ENEM
1.0000 | ENEMA | Freq: Once | RECTAL | Status: AC
  Administered 2017-05-02: 1 via RECTAL
  Filled 2017-05-02: qty 1

## 2017-05-02 MED ORDER — INSULIN ASPART 100 UNIT/ML ~~LOC~~ SOLN
0.0000 [IU] | Freq: Four times a day (QID) | SUBCUTANEOUS | Status: DC
  Administered 2017-05-02: 1 [IU] via SUBCUTANEOUS

## 2017-05-02 MED ORDER — SODIUM CHLORIDE 0.9 % IV SOLN
INTRAVENOUS | Status: DC
  Administered 2017-05-02: 14:00:00 via INTRAVENOUS

## 2017-05-02 MED ORDER — DIPHENHYDRAMINE HCL 25 MG PO CAPS
25.0000 mg | ORAL_CAPSULE | Freq: Once | ORAL | Status: AC
  Administered 2017-05-02: 25 mg via ORAL
  Filled 2017-05-02: qty 1

## 2017-05-02 MED ORDER — FLUCONAZOLE 100 MG PO TABS
100.0000 mg | ORAL_TABLET | Freq: Every day | ORAL | Status: DC
  Administered 2017-05-03: 100 mg via ORAL
  Filled 2017-05-02: qty 1

## 2017-05-02 MED ORDER — LIDOCAINE 2% (20 MG/ML) 5 ML SYRINGE
INTRAMUSCULAR | Status: DC | PRN
  Administered 2017-05-02: 100 mg via INTRAVENOUS

## 2017-05-02 MED ORDER — FUROSEMIDE 10 MG/ML IJ SOLN
20.0000 mg | Freq: Once | INTRAMUSCULAR | Status: DC
  Filled 2017-05-02: qty 2

## 2017-05-02 MED ORDER — ONDANSETRON HCL 4 MG/2ML IJ SOLN
INTRAMUSCULAR | Status: DC | PRN
  Administered 2017-05-02: 4 mg via INTRAVENOUS

## 2017-05-02 MED ORDER — PROPOFOL 500 MG/50ML IV EMUL
INTRAVENOUS | Status: DC | PRN
  Administered 2017-05-02: 150 ug/kg/min via INTRAVENOUS

## 2017-05-02 SURGICAL SUPPLY — 21 items

## 2017-05-02 NOTE — Progress Notes (Addendum)
PROGRESS NOTE    Thomas Rangel  ZOX:[id number] DOB: 13-Oct-1940 DOA: 04/30/2017 PCP: No primary care provider on file.   Brief Narrative:  Patient is a pleasant 76 year old gentleman history of hypertension, well-controlled diabetes mellitus, hyperlipidemia, gastroesophageal reflux disease, history of remote peptic ulcer disease presented to the ED with sudden onset bright red blood per rectum/maroon stools.  Patient had a total of 8 bloody bowel movements.  Patient placed on IV PPI.  GI consultation pending.  Patient for probable endoscopy today.   Assessment & Plan:   Principal Problem:   GIB (gastrointestinal bleeding) Active Problems:   Acute blood loss anemia   Candida esophagitis (HCC): Per EGD 05/01/2017   Hypokalemia   AKI (acute kidney injury) (HCC)   Dehydration   HTN (hypertension)   Leukocytosis   DM type 2 (diabetes mellitus, type 2) (HCC)   Esophageal stenosis   [place] grade A esophagitis  #1 GI bleed Questionable etiology.  Patient had presented with multiple episodes of bloody bowel movements which are maroon colored.  Patient had a total of about 8 bloody bowel movements on day of admission.  Patient with bloody bowel movements with colonoscopy prep however seems to be clearing up this morning.  Hemoglobin currently at 7.7 from 11.2 on admission.  Patient denies any abdominal pain.  Patient status post upper endoscopy on 05/01/2017 that showed benign-appearing esophageal stenosis, LA grade a esophagitis, small ischial hernia, monilial esophagitis, normal duodenal bulb, first portion of duodenum and second portion of duodenum.  Continue Protonix 40 mg IV every 12 hours.  Patient currently n.p.o. in anticipation of colonoscopy today 05/02/2017.  GI recommending treating esophageal candidiasis after workup of his GI bleed has been completed.  Transfuse 2 units packed red blood cells.  Continue IV fluids.  Supportive care.  GI following. Follow.  #2 esophageal  candidiasis/benign-appearing esophageal stenosis/LA grade a esophagitis Per upper endoscopy 05/01/2017.  GI recommending to wait until workup of GI bleeding has been completed prior to treating esophageal candidiasis and as such we will await GIs go ahead.  Patient will also need a repeat endoscopy in 3 months to reassess distal esophagus after PPI therapy per GI.  3.  Acute blood loss anemia Secondary to problem #1.  Hemoglobin currently at 7.7 from 11.2 on admission.  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  Transfuse 2 units packed red blood cells.  Follow H&H.   4.  Dehydration IV fluids. 5.  Acute kidney injury Likely prerenal azotemia secondary to problem #1.  Improved with hydration.  Continue to hold ACE inhibitor and diuretic.  6.  Hypertension Blood pressure stable.  Continue to hold ACE inhibitor and diuretic secondary to problems #1, 3, 4,5.  7.  Leukocytosis Likely a reactive leukocytosis.  Chest x-ray was negative.  Urinalysis was unremarkable.  The patient is afebrile.  Leukocytosis trended down.  No need for antibiotics.  8.  Well controlled diabetes mellitus type 2 Hemoglobin A1c 6.6.  CBGs have ranged from 108 -130.  Oral hypoglycemic agents on hold.  Continue sliding scale insulin.  9.  Hypokalemia/hypomagnesemia Repleted.    DVT prophylaxis: SCDs Code Status: Full Family Communication: Updated patient and family at bedside. Disposition Plan: Likely home once workup completed, medically stable no further bleeding and per GI.   Consultants:   Gastroenterology: Dr Dulce Sellar 05/01/2017  Procedures:   Chest x-ray 04/30/2017  Upper endoscopy 05/01/2017 per Dr. Dulce Sellar--- LA grade a esophagitis, benign-appearing esophageal stenosis, small hiatal hernia,monilial esophagitis, normal stomach, normal  duodenal bulb, first portion of duodenum and second portion of duodenum.  Transfuse 2 units packed red blood cells 05/02/2017  Antimicrobials:    None   Subjective: Patient sleeping easily arousable.  Denies any chest pain.  No shortness of breath.  Patient states had bloody bowel movements with prep last night however seems to have cleared up this morning as prep this morning was clear.  Denies any abdominal pain.  No dysphasia.  No odynophagia.   Objective: Vitals:   05/01/17 1455 05/01/17 2032 05/02/17 0356 05/02/17 0357  BP: 133/65 135/63 (!) 112/59   Pulse: 69 71 75   Resp: 17 18 17    Temp: 98.5 F (36.9 C) 98.1 F (36.7 C) 98.4 F (36.9 C)   TempSrc: Oral Oral Oral   SpO2: 100% 100% 100%   Weight:    90.7 kg (199 lb 15.3 oz)  Height:        Intake/Output Summary (Last 24 hours) at 05/02/2017 0857 Last data filed at 05/02/2017 0500 Gross per 24 hour  Intake 4760 ml  Output 750 ml  Net 4010 ml   Filed Weights   05/01/17 0443 05/01/17 0850 05/02/17 0357  Weight: 90.8 kg (200 lb 2.8 oz) 90.8 kg (200 lb 2.8 oz) 90.7 kg (199 lb 15.3 oz)    Examination:  General exam: Sleeping. Respiratory system: Clear to auscultation.  No wheezing, no crackles, no rhonchi.  Respiratory effort normal. Cardiovascular system: The rate and rhythm no murmurs rubs or gallops.  No lower extremity edema.  Gastrointestinal system: Abdomen is soft, nontender, nondistended, positive bowel sounds. Central nervous system: Alert and oriented. No focal neurological deficits. Extremities: Symmetric 5 x 5 power. Skin: No rashes, lesions or ulcers Psychiatry: Judgement and insight appear normal. Mood & affect appropriate.     Data Reviewed: I have personally reviewed following labs and imaging studies  CBC: Recent Labs  Lab 04/30/17 0810 04/30/17 1445 04/30/17 1855 05/01/17 0214 05/01/17 1045 05/02/17 0207  WBC 15.6* 12.0* 10.6* 9.7 7.7 7.9  NEUTROABS 11.2*  --   --   --   --   --   HGB 11.2* 9.1* 9.1* 8.8* 8.2* 7.7*  HCT 33.0* 26.8* 26.5* 25.4* 23.7* 22.1*  MCV 95.9 96.4 95.0 95.5 96.0 95.7  PLT 178 148* 147* 137* 121* 119*    Basic Metabolic Panel: Recent Labs  Lab 04/30/17 0810 04/30/17 1855 04/30/17 1856 05/01/17 0214 05/02/17 0207  NA 135  --  139 139 140  K 3.4*  --  3.6 3.8 3.7  CL 103  --  109 112* 115*  CO2 23  --  25 25 23   GLUCOSE 311*  --  157* 116* 112*  BUN 26*  --  22* 18 10  CREATININE 1.59*  --  1.11 1.04 1.04  CALCIUM 8.6*  --  7.9* 8.0* 7.8*  MG  --  1.5*  --  2.4  --    GFR: Estimated Creatinine Clearance: 68.3 mL/min (by C-G formula based on SCr of 1.04 mg/dL). Liver Function Tests: Recent Labs  Lab 04/30/17 1855 05/01/17 0214  AST 20 18  ALT 13* 13*  ALKPHOS 39 37*  BILITOT 0.6 0.6  PROT 5.5* 5.4*  ALBUMIN 3.1* 3.0*   No results for input(s): LIPASE, AMYLASE in the last 168 hours. No results for input(s): AMMONIA in the last 168 hours. Coagulation Profile: Recent Labs  Lab 05/01/17 0214  INR 1.15   Cardiac Enzymes: No results for input(s): CKTOTAL, CKMB, CKMBINDEX, TROPONINI in the  last 168 hours. BNP (last 3 results) No results for input(s): PROBNP in the last 8760 hours. HbA1C: Recent Labs    04/30/17 1853  HGBA1C 6.6*   CBG: Recent Labs  Lab 05/01/17 0009 05/01/17 0609 05/01/17 1158 05/01/17 1715 05/02/17 0630  GLUCAP 129* 119* 125* 130* 108*   Lipid Profile: Recent Labs    05/01/17 0214  CHOL 120  HDL 34*  LDLCALC 63  TRIG [id number]  CHOLHDL 3.5   Thyroid Function Tests: No results for input(s): TSH, T4TOTAL, FREET4, T3FREE, THYROIDAB in the last 72 hours. Anemia Panel: Recent Labs    04/30/17 1855  VITAMINB12 878  FOLATE 27.0  FERRITIN 127  TIBC 248*  IRON 56  RETICCTPCT 2.0   Sepsis Labs: No results for input(s): PROCALCITON, LATICACIDVEN in the last 168 hours.  No results found for this or any previous visit (from the past 240 hour(s)).       Radiology Studies: X-ray Chest Pa And Lateral  Result Date: 04/30/2017 CLINICAL DATA:  76 year old male with leukocytosis. EXAM: CHEST  2 VIEW COMPARISON:  None. FINDINGS: A 5 mm  nodular appearing density over the right posterior eighth rib may be a vessel on end or represent a pulmonary nodule. CT of the chest on nonemergent basis may provide better evaluation. The lungs are otherwise clear. There is no pleural effusion or pneumothorax. The cardiac silhouette is within normal limits. Mild atherosclerotic calcification of the aortic arch. No acute osseous pathology. IMPRESSION: No active cardiopulmonary disease. Electronically Signed   By: Elgie CollardArash  Radparvar M.D.   On: 04/30/2017 22:01        Scheduled Meds: . acetaminophen  650 mg Oral Once  . diltiazem  120 mg Oral Daily  . diphenhydrAMINE  25 mg Oral Once  . furosemide  20 mg Intravenous Once  . insulin aspart  0-9 Units Subcutaneous TID WC  . pantoprazole (PROTONIX) IV  40 mg Intravenous Q12H  . sodium phosphate  1 enema Rectal Once   Continuous Infusions: . sodium chloride 100 mL/hr at 05/02/17 0353  . sodium chloride       LOS: 2 days    Time spent: 40 mins    Ramiro Harvestaniel Edd Reppert, MD [facility] Pager [phone]  If 7PM-7AM, please contact night-coverage [email or web address] Password [email or web address] 05/02/2017, 8:57 AM

## 2017-05-02 NOTE — H&P (View-Only) (Signed)
PROGRESS NOTE    Thomas Rangel  ZOX:[id number] DOB: 12/16/1940 DOA: 04/30/2017 PCP: No primary care provider on file.   Brief Narrative:  Patient is a pleasant 76 year old gentleman history of hypertension, well-controlled diabetes mellitus, hyperlipidemia, gastroesophageal reflux disease, history of remote peptic ulcer disease presented to the ED with sudden onset bright red blood per rectum/maroon stools.  Patient had a total of 8 bloody bowel movements.  Patient placed on IV PPI.  GI consultation pending.  Patient for probable endoscopy today.   Assessment & Plan:   Principal Problem:   GIB (gastrointestinal bleeding) Active Problems:   Acute blood loss anemia   Candida esophagitis (HCC): Per EGD 05/01/2017   Hypokalemia   AKI (acute kidney injury) (HCC)   Dehydration   HTN (hypertension)   Leukocytosis   DM type 2 (diabetes mellitus, type 2) (HCC)   Esophageal stenosis   [place] grade A esophagitis  #1 GI bleed Questionable etiology.  Patient had presented with multiple episodes of bloody bowel movements which are maroon colored.  Patient had a total of about 8 bloody bowel movements on day of admission.  Patient with bloody bowel movements with colonoscopy prep however seems to be clearing up this morning.  Hemoglobin currently at 7.7 from 11.2 on admission.  Patient denies any abdominal pain.  Patient status post upper endoscopy on 05/01/2017 that showed benign-appearing esophageal stenosis, LA grade a esophagitis, small ischial hernia, monilial esophagitis, normal duodenal bulb, first portion of duodenum and second portion of duodenum.  Continue Protonix 40 mg IV every 12 hours.  Patient currently n.p.o. in anticipation of colonoscopy today 05/02/2017.  GI recommending treating esophageal candidiasis after workup of his GI bleed has been completed.  Transfuse 2 units packed red blood cells.  Continue IV fluids.  Supportive care.  GI following. Follow.  #2 esophageal  candidiasis/benign-appearing esophageal stenosis/LA grade a esophagitis Per upper endoscopy 05/01/2017.  GI recommending to wait until workup of GI bleeding has been completed prior to treating esophageal candidiasis and as such we will await GIs go ahead.  Patient will also need a repeat endoscopy in 3 months to reassess distal esophagus after PPI therapy per GI.  3.  Acute blood loss anemia Secondary to problem #1.  Hemoglobin currently at 7.7 from 11.2 on admission.  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  Transfuse 2 units packed red blood cells.  Follow H&H.   4.  Dehydration IV fluids. 5.  Acute kidney injury Likely prerenal azotemia secondary to problem #1.  Improved with hydration.  Continue to hold ACE inhibitor and diuretic.  6.  Hypertension Blood pressure stable.  Continue to hold ACE inhibitor and diuretic secondary to problems #1, 3, 4,5.  7.  Leukocytosis Likely a reactive leukocytosis.  Chest x-ray was negative.  Urinalysis was unremarkable.  The patient is afebrile.  Leukocytosis trended down.  No need for antibiotics.  8.  Well controlled diabetes mellitus type 2 Hemoglobin A1c 6.6.  CBGs have ranged from 108 -130.  Oral hypoglycemic agents on hold.  Continue sliding scale insulin.  9.  Hypokalemia/hypomagnesemia Repleted.    DVT prophylaxis: SCDs Code Status: Full Family Communication: Updated patient and family at bedside. Disposition Plan: Likely home once workup completed, medically stable no further bleeding and per GI.   Consultants:   Gastroenterology pending  Procedures:   Chest x-ray 04/30/2017  Upper endoscopy 05/01/2017 per Dr. Dulce Sellarutlaw--- LA grade a esophagitis, benign-appearing esophageal stenosis, small hiatal hernia,monilial esophagitis, normal stomach, normal duodenal bulb,  first portion of duodenum and second portion of duodenum.  Transfuse 2 units packed red blood cells 05/02/2017  Antimicrobials:    None   Subjective: Patient sleeping easily arousable.  Denies any chest pain.  No shortness of breath.  Patient states had bloody bowel movements with prep last night however seems to have cleared up this morning as prep this morning was clear.  Denies any abdominal pain.  No dysphasia.  No odynophagia.   Objective: Vitals:   05/01/17 1455 05/01/17 2032 05/02/17 0356 05/02/17 0357  BP: 133/65 135/63 (!) 112/59   Pulse: 69 71 75   Resp: 17 18 17    Temp: 98.5 F (36.9 C) 98.1 F (36.7 C) 98.4 F (36.9 C)   TempSrc: Oral Oral Oral   SpO2: 100% 100% 100%   Weight:    90.7 kg (199 lb 15.3 oz)  Height:        Intake/Output Summary (Last 24 hours) at 05/02/2017 0857 Last data filed at 05/02/2017 0500 Gross per 24 hour  Intake 4760 ml  Output 750 ml  Net 4010 ml   Filed Weights   05/01/17 0443 05/01/17 0850 05/02/17 0357  Weight: 90.8 kg (200 lb 2.8 oz) 90.8 kg (200 lb 2.8 oz) 90.7 kg (199 lb 15.3 oz)    Examination:  General exam: Sleeping. Respiratory system: Clear to auscultation.  No wheezing, no crackles, no rhonchi.  Respiratory effort normal. Cardiovascular system: The rate and rhythm no murmurs rubs or gallops.  No lower extremity edema.  Gastrointestinal system: Abdomen is soft, nontender, nondistended, positive bowel sounds. Central nervous system: Alert and oriented. No focal neurological deficits. Extremities: Symmetric 5 x 5 power. Skin: No rashes, lesions or ulcers Psychiatry: Judgement and insight appear normal. Mood & affect appropriate.     Data Reviewed: I have personally reviewed following labs and imaging studies  CBC: Recent Labs  Lab 04/30/17 0810 04/30/17 1445 04/30/17 1855 05/01/17 0214 05/01/17 1045 05/02/17 0207  WBC 15.6* 12.0* 10.6* 9.7 7.7 7.9  NEUTROABS 11.2*  --   --   --   --   --   HGB 11.2* 9.1* 9.1* 8.8* 8.2* 7.7*  HCT 33.0* 26.8* 26.5* 25.4* 23.7* 22.1*  MCV 95.9 96.4 95.0 95.5 96.0 95.7  PLT 178 148* 147* 137* 121* 119*    Basic Metabolic Panel: Recent Labs  Lab 04/30/17 0810 04/30/17 1855 04/30/17 1856 05/01/17 0214 05/02/17 0207  NA 135  --  139 139 140  K 3.4*  --  3.6 3.8 3.7  CL 103  --  109 112* 115*  CO2 23  --  25 25 23   GLUCOSE 311*  --  157* 116* 112*  BUN 26*  --  22* 18 10  CREATININE 1.59*  --  1.11 1.04 1.04  CALCIUM 8.6*  --  7.9* 8.0* 7.8*  MG  --  1.5*  --  2.4  --    GFR: Estimated Creatinine Clearance: 68.3 mL/min (by C-G formula based on SCr of 1.04 mg/dL). Liver Function Tests: Recent Labs  Lab 04/30/17 1855 05/01/17 0214  AST 20 18  ALT 13* 13*  ALKPHOS 39 37*  BILITOT 0.6 0.6  PROT 5.5* 5.4*  ALBUMIN 3.1* 3.0*   No results for input(s): LIPASE, AMYLASE in the last 168 hours. No results for input(s): AMMONIA in the last 168 hours. Coagulation Profile: Recent Labs  Lab 05/01/17 0214  INR 1.15   Cardiac Enzymes: No results for input(s): CKTOTAL, CKMB, CKMBINDEX, TROPONINI in the last 168  hours. BNP (last 3 results) No results for input(s): PROBNP in the last 8760 hours. HbA1C: Recent Labs    04/30/17 1853  HGBA1C 6.6*   CBG: Recent Labs  Lab 05/01/17 0009 05/01/17 0609 05/01/17 1158 05/01/17 1715 05/02/17 0630  GLUCAP 129* 119* 125* 130* 108*   Lipid Profile: Recent Labs    05/01/17 0214  CHOL 120  HDL 34*  LDLCALC 63  TRIG 161114  CHOLHDL 3.5   Thyroid Function Tests: No results for input(s): TSH, T4TOTAL, FREET4, T3FREE, THYROIDAB in the last 72 hours. Anemia Panel: Recent Labs    04/30/17 1855  VITAMINB12 878  FOLATE 27.0  FERRITIN 127  TIBC 248*  IRON 56  RETICCTPCT 2.0   Sepsis Labs: No results for input(s): PROCALCITON, LATICACIDVEN in the last 168 hours.  No results found for this or any previous visit (from the past 240 hour(s)).       Radiology Studies: X-ray Chest Pa And Lateral  Result Date: 04/30/2017 CLINICAL DATA:  76 year old male with leukocytosis. EXAM: CHEST  2 VIEW COMPARISON:  None. FINDINGS: A 5 mm  nodular appearing density over the right posterior eighth rib may be a vessel on end or represent a pulmonary nodule. CT of the chest on nonemergent basis may provide better evaluation. The lungs are otherwise clear. There is no pleural effusion or pneumothorax. The cardiac silhouette is within normal limits. Mild atherosclerotic calcification of the aortic arch. No acute osseous pathology. IMPRESSION: No active cardiopulmonary disease. Electronically Signed   By: Elgie CollardArash  Radparvar M.D.   On: 04/30/2017 22:01        Scheduled Meds: . acetaminophen  650 mg Oral Once  . diltiazem  120 mg Oral Daily  . diphenhydrAMINE  25 mg Oral Once  . furosemide  20 mg Intravenous Once  . insulin aspart  0-9 Units Subcutaneous TID WC  . pantoprazole (PROTONIX) IV  40 mg Intravenous Q12H  . sodium phosphate  1 enema Rectal Once   Continuous Infusions: . sodium chloride 100 mL/hr at 05/02/17 0353  . sodium chloride       LOS: 2 days    Time spent: 40 mins    Ramiro Harvestaniel Vedanth Sirico, MD [facility] Pager [phone]  If 7PM-7AM, please contact night-coverage [email or web address] Password [email or web address] 05/02/2017, 8:57 AM

## 2017-05-02 NOTE — Progress Notes (Signed)
Patient's Hgb this AM was 7.7. On call made aware. No new orders given.

## 2017-05-02 NOTE — Anesthesia Postprocedure Evaluation (Signed)
Anesthesia Post Note  Patient: Thomas Rangel  Procedure(s) Performed: COLONOSCOPY WITH PROPOFOL (N/A )     Patient location during evaluation: Endoscopy Anesthesia Type: MAC Level of consciousness: awake and alert Pain management: pain level controlled Vital Signs Assessment: post-procedure vital signs reviewed and stable Respiratory status: spontaneous breathing, nonlabored ventilation, respiratory function stable and patient connected to nasal cannula oxygen Cardiovascular status: stable and blood pressure returned to baseline Postop Assessment: no apparent nausea or vomiting Anesthetic complications: no    Last Vitals:  Vitals:   05/02/17 1250 05/02/17 1425  BP: (!) 103/55 121/63  Pulse: 66 (!) 58  Resp: 15 16  Temp:  36.9 C  SpO2: 99% 100%    Last Pain:  Vitals:   05/02/17 1425  TempSrc: Oral                 Thomas Rangel,JAMES TERRILL

## 2017-05-02 NOTE — Transfer of Care (Signed)
Immediate Anesthesia Transfer of Care Note  Patient: Thomas Rangel  Procedure(s) Performed: Procedure(s): COLONOSCOPY WITH PROPOFOL (N/A)  Patient Location: PACU  Anesthesia Type:MAC  Level of Consciousness:  sedated, patient cooperative and responds to stimulation  Airway & Oxygen Therapy:Patient Spontanous Breathing and Patient connected to face mask oxgen  Post-op Assessment:  Report given to PACU RN and Post -op Vital signs reviewed and stable  Post vital signs:  Reviewed and stable  Last Vitals:  Vitals:   05/02/17 0356 05/02/17 1116  BP: (!) 112/59 (!) 143/59  Pulse: 75   Resp: 17 14  Temp: 36.9 C 37.2 C  SpO2: 130% 865%    Complications: No apparent anesthesia complications

## 2017-05-02 NOTE — Interval H&P Note (Signed)
History and Physical Interval Note:  05/02/2017 11:54 AM  Thomas Rangel  has presented today for surgery, with the diagnosis of melena anemia  The various methods of treatment have been discussed with the patient and family. After consideration of risks, benefits and other options for treatment, the patient has consented to  Procedure(s): COLONOSCOPY WITH PROPOFOL (N/A) as a surgical intervention .  The patient's history has been reviewed, patient examined, no change in status, stable for surgery.  I have reviewed the patient's chart and labs.  Questions were answered to the patient's satisfaction.     Freddy JakschUTLAW,Majorie Santee M

## 2017-05-02 NOTE — Op Note (Signed)
[facility] Patient Name: Thomas Rangel Procedure Date: 05/02/2017 MRN: [id number] Attending MD: Willis Modena , MD Date of Birth: 06-27-1940 CSN: [id number] Age: 76 Admit Type: Inpatient Procedure:                Colonoscopy Indications:              This is the patient's first colonoscopy,                            Hematochezia, Acute post hemorrhagic anemia Providers:                Willis Modena, MD, Janae Sauce. Steele Berg, RN, Zoila Shutter, Technician, Lynnell Jude. Freida Busman CRNA, CRNA Referring MD:              Medicines:                Monitored Anesthesia Care Complications:            No immediate complications. Estimated Blood Loss:     Estimated blood loss: none. Procedure:                Pre-Anesthesia Assessment:                           - Prior to the procedure, a History and Physical                            was performed, and patient medications and                            allergies were reviewed. The patient's tolerance of                            previous anesthesia was also reviewed. The risks                            and benefits of the procedure and the sedation                            options and risks were discussed with the patient.                            All questions were answered, and informed consent                            was obtained. Prior Anticoagulants: The patient has                            taken no previous anticoagulant or antiplatelet                            agents. ASA Grade Assessment: II - A patient with  mild systemic disease. After reviewing the risks                            and benefits, the patient was deemed in                            satisfactory condition to undergo the procedure.                           After obtaining informed consent, the colonoscope                            was passed under direct vision. Throughout the                             procedure, the patient's blood pressure, pulse, and                            oxygen saturations were monitored continuously. The                            EC-[id number] ([id number]) scope was introduced through                            the anus and advanced to the the cecum, identified                            by appendiceal orifice and ileocecal valve. The                            ileocecal valve, appendiceal orifice, and rectum                            were photographed. The entire colon was examined.                            The colonoscopy was performed without difficulty.                            The patient tolerated the procedure well. The                            quality of the bowel preparation was good. Scope In: 12:08:08 PM Scope Out: 12:25:51 PM Scope Withdrawal Time: 0 hours 8 minutes 7 seconds  Total Procedure Duration: 0 hours 17 minutes 43 seconds  Findings:      The perianal and digital rectal examinations were normal.      Many small and large-mouthed diverticula were found in the sigmoid colon       and descending colon.      A few medium-mouthed diverticula were found in the ascending colon.      Colon otherwise normal; no other polyps, masses, vascular ectasias, or       inflammatory changes were seen.      No old or fresh blood was seen to the  extent of our examination.      The retroflexed view of the distal rectum and anal verge was normal and       showed no anal or rectal abnormalities. Impression:               - Diverticulosis in the sigmoid colon and in the                            descending colon.                           - Diverticulosis in the ascending colon.                           - The distal rectum and anal verge are normal on                            retroflexion view.                           - Suspect bleeding is diverticular, and seems to                            have resolved. Moderate Sedation:       None Recommendation:           - Patient has a contact number available for                            emergencies. The signs and symptoms of potential                            delayed complications were discussed with the                            patient. Return to normal activities tomorrow.                            Written discharge instructions were provided to the                            patient.                           - Discharge patient to home (ambulatory).                           - Soft diet today.                           - Continue present medications.                           - No colonoscopy repeat, in absence of new/interval                            symptoms, in light of patient's age.                           -  Ok to discharge home today from GI perspective.                           Deboraha Sprang- [facility] will sign-off; we can arrange outpatient                            follow-up with us within the next 2-3 weeks for                            reassessment and recheck CBC; please call with any                            questions; thank you for the consultation.                           - No repeat colonoscopy due to age. Procedure Code(s):        --- Professional ---                           [phone], Colonoscopy, flexible; diagnostic, including                            collection of specimen(s) by brushing or washing,                            when performed (separate procedure) Diagnosis Code(s):        --- Professional ---                           K92.1, Melena (includes Hematochezia)                           D62, Acute posthemorrhagic anemia                           K57.30, Diverticulosis of large intestine without                            perforation or abscess without bleeding CPT copyright 2016 American Medical Association. All rights reserved. The codes documented in this report are preliminary and upon coder review may  be revised to meet current  compliance requirements. Willis ModenaWilliam Arnetia Bronk, MD 05/02/2017 12:40:40 PM This report has been signed electronically. Number of Addenda: 0

## 2017-05-02 NOTE — Anesthesia Preprocedure Evaluation (Signed)
Anesthesia Evaluation  Patient identified by MRN, date of birth, ID band Patient awake    Reviewed: Allergy & Precautions, NPO status , Patient's Chart, lab work & pertinent test results  Airway Mallampati: II  TM Distance: >3 FB Neck ROM: Full    Dental   Pulmonary neg pulmonary ROS,    breath sounds clear to auscultation       Cardiovascular hypertension, Pt. on medications  Rhythm:Regular Rate:Normal     Neuro/Psych negative neurological ROS     GI/Hepatic Neg liver ROS, GI bleed   Endo/Other  diabetes, Type 2  Renal/GU Renal disease     Musculoskeletal   Abdominal   Peds  Hematology  (+) anemia ,   Anesthesia Other Findings   Reproductive/Obstetrics                             Anesthesia Physical Anesthesia Plan  ASA: III  Anesthesia Plan: MAC   Post-op Pain Management:    Induction: Intravenous  PONV Risk Score and Plan: 1 and Ondansetron, Propofol infusion and Treatment may vary due to age or medical condition  Airway Management Planned: Natural Airway and Nasal Cannula  Additional Equipment:   Intra-op Plan:   Post-operative Plan:   Informed Consent: I have reviewed the patients History and Physical, chart, labs and discussed the procedure including the risks, benefits and alternatives for the proposed anesthesia with the patient or authorized representative who has indicated his/her understanding and acceptance.     Plan Discussed with: CRNA  Anesthesia Plan Comments:        Anesthesia Quick Evaluation  

## 2017-05-03 LAB — BPAM RBC
Blood Product Expiration Date: 201901012359
Blood Product Expiration Date: 201901052359
ISSUE DATE / TIME: 201812061420
ISSUE DATE / TIME: 201812061838
UNIT TYPE AND RH: 5100
Unit Type and Rh: 5100

## 2017-05-03 LAB — TYPE AND SCREEN
ABO/RH(D): O POS
ANTIBODY SCREEN: NEGATIVE
UNIT DIVISION: 0
UNIT DIVISION: 0

## 2017-05-03 LAB — BASIC METABOLIC PANEL
Anion gap: 5 (ref 5–15)
BUN: 14 mg/dL (ref 6–20)
CO2: 24 mmol/L (ref 22–32)
CREATININE: 1.21 mg/dL (ref 0.61–1.24)
Calcium: 7.8 mg/dL — ABNORMAL LOW (ref 8.9–10.3)
Chloride: 113 mmol/L — ABNORMAL HIGH (ref 101–111)
GFR calc Af Amer: >60 mL/min (ref >60)
GFR, EST NON AFRICAN AMERICAN: 57 mL/min — AB (ref >60)
Glucose, Bld: 113 mg/dL — ABNORMAL HIGH (ref 65–99)
Potassium: 3.9 mmol/L (ref 3.5–5.1)
SODIUM: 142 mmol/L (ref 135–145)

## 2017-05-03 LAB — CBC WITH DIFFERENTIAL/PLATELET
BASOS ABS: 0 10*3/uL (ref 0.0–0.1)
BASOS PCT: 0 %
EOS ABS: 0.2 10*3/uL (ref 0.0–0.7)
EOS PCT: 2 %
HCT: 26 % — ABNORMAL LOW (ref 39.0–52.0)
Hemoglobin: 9.1 g/dL — ABNORMAL LOW (ref 13.0–17.0)
Lymphocytes Relative: 34 %
Lymphs Abs: 3.2 10*3/uL (ref 0.7–4.0)
MCH: 32.3 pg (ref 26.0–34.0)
MCHC: 35 g/dL (ref 30.0–36.0)
MCV: 92.2 fL (ref 78.0–100.0)
Monocytes Absolute: 0.5 10*3/uL (ref 0.1–1.0)
Monocytes Relative: 6 %
Neutro Abs: 5.4 10*3/uL (ref 1.7–7.7)
Neutrophils Relative %: 58 %
PLATELETS: 111 10*3/uL — AB (ref 150–400)
RBC: 2.82 MIL/uL — AB (ref 4.22–5.81)
RDW: 15.2 % (ref 11.5–15.5)
WBC: 9.3 10*3/uL (ref 4.0–10.5)

## 2017-05-03 LAB — GLUCOSE, CAPILLARY
GLUCOSE-CAPILLARY: 107 mg/dL — AB (ref 65–99)
GLUCOSE-CAPILLARY: 114 mg/dL — AB (ref 65–99)
Glucose-Capillary: 111 mg/dL — ABNORMAL HIGH (ref 65–99)
Glucose-Capillary: 158 mg/dL — ABNORMAL HIGH (ref 65–99)

## 2017-05-03 LAB — HEMOGLOBIN AND HEMATOCRIT, BLOOD
HCT: 26.1 % — ABNORMAL LOW (ref 39.0–52.0)
HEMOGLOBIN: 9 g/dL — AB (ref 13.0–17.0)

## 2017-05-03 MED ORDER — INSULIN ASPART 100 UNIT/ML ~~LOC~~ SOLN
0.0000 [IU] | Freq: Three times a day (TID) | SUBCUTANEOUS | Status: DC
  Administered 2017-05-03: 2 [IU] via SUBCUTANEOUS

## 2017-05-03 MED ORDER — FLUCONAZOLE 100 MG PO TABS: 100.0000 mg | ORAL_TABLET | Freq: Every day | ORAL | 0 refills | Status: DC

## 2017-05-03 MED ORDER — INSULIN ASPART 100 UNIT/ML ~~LOC~~ SOLN: 0.0000 [IU] | Freq: Every day | SUBCUTANEOUS | Status: DC

## 2017-05-03 NOTE — Progress Notes (Signed)
Pt discharge home with no needs.

## 2017-05-03 NOTE — Progress Notes (Signed)
Pt and family given discharge teaching/instructions including medications and schedule of medication. Understanding verbalized and Pt at time of discahrge given discharge packet.

## 2017-05-03 NOTE — Care Management Important Message (Signed)
Important Message  Patient Details  Name: Thomas Rangel MRN: [id number] Date of Birth: 11/22/1940   Medicare Important Message Given:  Yes    Caren MacadamFuller, Tasman Zapata 05/03/2017, 10:30 AMImportant Message  Patient Details  Name: Thomas Rangel MRN: [id number] Date of Birth: 12/01/1940   Medicare Important Message Given:  Yes    Caren MacadamFuller, Temika Sutphin 05/03/2017, 10:30 AM

## 2017-05-04 NOTE — Discharge Summary (Signed)
Physician Discharge Summary  Thomas Rangel ZOX:[id number] DOB: 11/24/1940 DOA: 04/30/2017  PCP: No primary care provider on file.  Admit date: 04/30/2017 Discharge date: 05/03/2017  Admitted From: Home.  Disposition:  Home.   Recommendations for Outpatient Follow-up:  1. Follow up with PCP in 1-2 weeks 2. Please obtain BMP/CBC in one week Please follow up with Gi as needed.   Discharge Condition:stable.  CODE STATUS: full code.  Diet recommendation: Heart Healthy / Carb Modified  Brief/Interim Summary: Patient is a pleasant 76 year old gentleman history of hypertension, well-controlled diabetes mellitus, hyperlipidemia, gastroesophageal reflux disease, history of remote peptic ulcer disease presented to the ED with sudden onset bright red blood per rectum/maroon stools.  Patient had a total of 8 bloody bowel movements.  Patient placed on IV PPI. Gastroenterology consulted and pt underwent conoscopy showing diffuse diverticulosis.     Discharge Diagnoses:  Principal Problem:   GIB (gastrointestinal bleeding) Active Problems:   Hypokalemia   AKI (acute kidney injury) (HCC)   Dehydration   HTN (hypertension)   Leukocytosis   Acute blood loss anemia   DM type 2 (diabetes mellitus, type 2) (HCC)   Candida esophagitis (HCC): Per EGD 05/01/2017   Esophageal stenosis   [place] grade A esophagitis   Diverticulosis: Per colonoscopy 05/02/2017   Diverticular hemorrhage: Probable  GI BLEED secondary to diverticulosis of the colon.  S/p 2 units of prbc transfusion.  Repeat H&H is stable.  S/p EGD shows benign appearing esophageal stenosis,  LA grade a esophagitis, small ischial hernia, monilial esophagitis, normal duodenal bulb, first portion of duodenum and second portion of duodenum.    Esophageal candidiasis. Arnoldo Morale/benign-appearing esophageal stenosis/LA grade a esophagitis Per upper endoscopy 05/01/2017.  GI recommending to wait until workup of GI bleeding has been completed prior to  treating esophageal candidiasis and as such we will await GIs go ahead.  Patient will also need a repeat endoscopy in 3 months to reassess distal esophagus after PPI therapy per GI.   Acute blood loss anemia:  S/p 2 units of prbc transfusion. Sec to diverticulosis.  Stable H&H on discharge stable.    4.  Dehydration IV fluids. 5.  Acute kidney injury Likely prerenal azotemia secondary to problem #1.  Improved with hydration.  Continue to hold ACE inhibitor and diuretic.  6.  Hypertension Blood pressure stable.  Continue to hold ACE inhibitor and diuretic secondary to problems #1, 3, 4,5.  7.  Leukocytosis Likely a reactive leukocytosis.  Chest x-ray was negative.  Urinalysis was unremarkable.  The patient is afebrile.  Leukocytosis trended down.  No need for antibiotics.  8.  Well controlled diabetes mellitus type 2 Hemoglobin A1c 6.6.  CBGs have ranged from 108 -130.  Oral hypoglycemic agents on hold.  Continue sliding scale insulin.  9.  Hypokalemia/hypomagnesemia Repleted.     Discharge Instructions  Discharge Instructions    Diet - low sodium heart healthy   Complete by:  As directed    Discharge instructions   Complete by:  As directed    Follow up with PCP in one week. Please follow up with Dr Dulce Sellarutlaw as recommended     Allergies as of 05/03/2017      Reactions   Simvastatin    Other reaction(s): Myalgias (intolerance), Other (See Comments)   Metformin Nausea Only   Other reaction(s): GI Upset (intolerance)   Penicillins Rash      Medication List    TAKE these medications   atorvastatin 10 MG tablet Commonly known as:  LIPITOR Take 10 mg by mouth every morning.   diltiazem 180 MG 24 hr capsule Commonly known as:  CARDIZEM CD Take 180 mg by mouth daily.   fluconazole 100 MG tablet Commonly known as:  DIFLUCAN Take 1 tablet (100 mg total) by mouth daily.   glimepiride 2 MG tablet Commonly known as:  AMARYL Take 2 mg by mouth daily with  breakfast.   hydrochlorothiazide 25 MG tablet Commonly known as:  HYDRODIURIL Take 25 mg by mouth daily.   lisinopril 30 MG tablet Commonly known as:  PRINIVIL,ZESTRIL Take 30 mg by mouth daily.   omega-3 acid ethyl esters 1 g capsule Commonly known as:  LOVAZA Take 2 g by mouth 2 (two) times daily.   pioglitazone 30 MG tablet Commonly known as:  ACTOS Take 30 mg by mouth daily.      Follow-up Information    primary care physician. Schedule an appointment as soon as possible for a visit in 1 week(s).          Allergies  Allergen Reactions  . Simvastatin     Other reaction(s): Myalgias (intolerance), Other (See Comments)  . Metformin Nausea Only    Other reaction(s): GI Upset (intolerance)  . Penicillins Rash    Consultations:  Gastroenterology.    Procedures/Studies: X-ray Chest Pa And Lateral  Result Date: 04/30/2017 CLINICAL DATA:  76 year old male with leukocytosis. EXAM: CHEST  2 VIEW COMPARISON:  None. FINDINGS: A 5 mm nodular appearing density over the right posterior eighth rib may be a vessel on end or represent a pulmonary nodule. CT of the chest on nonemergent basis may provide better evaluation. The lungs are otherwise clear. There is no pleural effusion or pneumothorax. The cardiac silhouette is within normal limits. Mild atherosclerotic calcification of the aortic arch. No acute osseous pathology. IMPRESSION: No active cardiopulmonary disease. Electronically Signed   By: Elgie CollardArash  Radparvar M.D.   On: 04/30/2017 22:01    Colonoscopy.    Subjective:  No new complaints.  Discharge Exam: Vitals:   05/03/17 0914 05/03/17 1223  BP: (!) 120/58 113/60  Pulse:  67  Resp:  18  Temp:  98.4 F (36.9 C)  SpO2:  100%   Vitals:   05/02/17 2204 05/03/17 0546 05/03/17 0914 05/03/17 1223  BP: (!) 118/58 (!) 115/57 (!) 120/58 113/60  Pulse: 65 65  67  Resp: 17 18  18   Temp: 98 F (36.7 C) 98.7 F (37.1 C)  98.4 F (36.9 C)  TempSrc: Oral Oral  Oral   SpO2: 100% 99%  100%  Weight:  93.7 kg (206 lb 8 oz)    Height:        General: Pt is alert, awake, not in acute distress Cardiovascular: RRR, S1/S2 +, no rubs, no gallops Respiratory: CTA bilaterally, no wheezing, no rhonchi Abdominal: Soft, NT, ND, bowel sounds + Extremities: no edema, no cyanosis    The results of significant diagnostics from this hospitalization (including imaging, microbiology, ancillary and laboratory) are listed below for reference.     Microbiology: Recent Results (from the past 240 hour(s))  Urine culture     Status: None   Collection Time: 04/30/17  6:39 PM  Result Value Ref Range Status   Specimen Description URINE, CLEAN CATCH  Final   Special Requests NONE  Final   Culture   Final    NO GROWTH Performed at [facility], [address] [place], [place] [address]    Report Status 05/02/2017 FINAL  Final  Labs: BNP (last 3 results) No results for input(s): BNP in the last 8760 hours. Basic Metabolic Panel: Recent Labs  Lab 04/30/17 0810 04/30/17 1855 04/30/17 1856 05/01/17 0214 05/02/17 0207 05/03/17 0439  NA 135  --  139 139 140 142  K 3.4*  --  3.6 3.8 3.7 3.9  CL 103  --  109 112* 115* 113*  CO2 23  --  25 25 23 24   GLUCOSE 311*  --  157* 116* 112* 113*  BUN 26*  --  22* 18 10 14   CREATININE 1.59*  --  1.11 1.04 1.04 1.21  CALCIUM 8.6*  --  7.9* 8.0* 7.8* 7.8*  MG  --  1.5*  --  2.4  --   --    Liver Function Tests: Recent Labs  Lab 04/30/17 1855 05/01/17 0214  AST 20 18  ALT 13* 13*  ALKPHOS 39 37*  BILITOT 0.6 0.6  PROT 5.5* 5.4*  ALBUMIN 3.1* 3.0*   No results for input(s): LIPASE, AMYLASE in the last 168 hours. No results for input(s): AMMONIA in the last 168 hours. CBC: Recent Labs  Lab 04/30/17 0810  05/01/17 0214 05/01/17 1045 05/02/17 0207 05/02/17 0854 05/02/17 2344 05/03/17 0439  WBC 15.6*   < > 9.7 7.7 7.9 7.8  --  9.3  NEUTROABS 11.2*  --   --   --   --   --   --  5.4  HGB 11.2*   < >  8.8* 8.2* 7.7* 7.8* 9.0* 9.1*  HCT 33.0*   < > 25.4* 23.7* 22.1* 22.6* 26.1* 26.0*  MCV 95.9   < > 95.5 96.0 95.7 95.8  --  92.2  PLT 178   < > 137* 121* 119* 121*  --  111*   < > = values in this interval not displayed.   Cardiac Enzymes: No results for input(s): CKTOTAL, CKMB, CKMBINDEX, TROPONINI in the last 168 hours. BNP: Invalid input(s): POCBNP CBG: Recent Labs  Lab 05/02/17 1805 05/03/17 0034 05/03/17 0545 05/03/17 0752 05/03/17 1220  GLUCAP 130* 114* 107* 111* 158*   D-Dimer No results for input(s): DDIMER in the last 72 hours. Hgb A1c No results for input(s): HGBA1C in the last 72 hours. Lipid Profile No results for input(s): CHOL, HDL, LDLCALC, TRIG, CHOLHDL, LDLDIRECT in the last 72 hours. Thyroid function studies No results for input(s): TSH, T4TOTAL, T3FREE, THYROIDAB in the last 72 hours.  Invalid input(s): FREET3 Anemia work up No results for input(s): VITAMINB12, FOLATE, FERRITIN, TIBC, IRON, RETICCTPCT in the last 72 hours. Urinalysis    Component Value Date/Time   COLORURINE STRAW (A) 04/30/2017 1839   APPEARANCEUR CLEAR 04/30/2017 1839   LABSPEC 1.011 04/30/2017 1839   PHURINE 5.0 04/30/2017 1839   GLUCOSEU NEGATIVE 04/30/2017 1839   HGBUR NEGATIVE 04/30/2017 1839   BILIRUBINUR NEGATIVE 04/30/2017 1839   KETONESUR NEGATIVE 04/30/2017 1839   PROTEINUR NEGATIVE 04/30/2017 1839   NITRITE NEGATIVE 04/30/2017 1839   LEUKOCYTESUR NEGATIVE 04/30/2017 1839   Sepsis Labs Invalid input(s): PROCALCITONIN,  WBC,  LACTICIDVEN Microbiology Recent Results (from the past 240 hour(s))  Urine culture     Status: None   Collection Time: 04/30/17  6:39 PM  Result Value Ref Range Status   Specimen Description URINE, CLEAN CATCH  Final   Special Requests NONE  Final   Culture   Final    NO GROWTH Performed at [facility], [address], [place], [place] [address]    Report Status 05/02/2017  FINAL  Final     Time coordinating discharge: Over 30  minutes  SIGNED:   Kathlen Mody, MD  [facility] 05/04/2017, 9:00 AM Pager   If 7PM-7AM, please contact night-coverage [email or web address] Password [email or web address]

## 2017-08-01 DIAGNOSIS — S46311A Strain of muscle, fascia and tendon of triceps, right arm, initial encounter: Secondary | ICD-10-CM

## 2017-08-01 HISTORY — DX: Strain of muscle, fascia and tendon of triceps, right arm, initial encounter: S46.311A

## 2019-10-07 DIAGNOSIS — N529 Male erectile dysfunction, unspecified: Secondary | ICD-10-CM | POA: Insufficient documentation

## 2019-10-07 DIAGNOSIS — R1319 Other dysphagia: Secondary | ICD-10-CM | POA: Insufficient documentation

## 2019-10-07 HISTORY — DX: Male erectile dysfunction, unspecified: N52.9

## 2019-10-07 HISTORY — DX: Other dysphagia: R13.19

## 2020-03-16 DIAGNOSIS — Z789 Other specified health status: Secondary | ICD-10-CM

## 2020-03-16 HISTORY — DX: Other specified health status: Z78.9

## 2020-06-01 DIAGNOSIS — B351 Tinea unguium: Secondary | ICD-10-CM | POA: Diagnosis not present

## 2020-06-01 DIAGNOSIS — L84 Corns and callosities: Secondary | ICD-10-CM | POA: Diagnosis not present

## 2020-06-01 DIAGNOSIS — M79672 Pain in left foot: Secondary | ICD-10-CM | POA: Diagnosis not present

## 2020-06-01 DIAGNOSIS — E119 Type 2 diabetes mellitus without complications: Secondary | ICD-10-CM | POA: Diagnosis not present

## 2020-06-01 DIAGNOSIS — M79671 Pain in right foot: Secondary | ICD-10-CM | POA: Diagnosis not present

## 2020-06-20 DIAGNOSIS — K5792 Diverticulitis of intestine, part unspecified, without perforation or abscess without bleeding: Secondary | ICD-10-CM | POA: Diagnosis not present

## 2020-07-19 DIAGNOSIS — Z8719 Personal history of other diseases of the digestive system: Secondary | ICD-10-CM | POA: Diagnosis not present

## 2020-07-19 DIAGNOSIS — Z1211 Encounter for screening for malignant neoplasm of colon: Secondary | ICD-10-CM | POA: Diagnosis not present

## 2020-07-19 DIAGNOSIS — K621 Rectal polyp: Secondary | ICD-10-CM | POA: Diagnosis not present

## 2020-07-19 DIAGNOSIS — K635 Polyp of colon: Secondary | ICD-10-CM | POA: Diagnosis not present

## 2020-07-19 DIAGNOSIS — K648 Other hemorrhoids: Secondary | ICD-10-CM | POA: Diagnosis not present

## 2020-07-19 DIAGNOSIS — D128 Benign neoplasm of rectum: Secondary | ICD-10-CM | POA: Diagnosis not present

## 2020-07-19 DIAGNOSIS — K6389 Other specified diseases of intestine: Secondary | ICD-10-CM | POA: Diagnosis not present

## 2020-07-19 DIAGNOSIS — K5730 Diverticulosis of large intestine without perforation or abscess without bleeding: Secondary | ICD-10-CM | POA: Diagnosis not present

## 2020-07-19 DIAGNOSIS — K649 Unspecified hemorrhoids: Secondary | ICD-10-CM | POA: Diagnosis not present

## 2020-08-31 DIAGNOSIS — L84 Corns and callosities: Secondary | ICD-10-CM | POA: Diagnosis not present

## 2020-08-31 DIAGNOSIS — M79671 Pain in right foot: Secondary | ICD-10-CM | POA: Diagnosis not present

## 2020-08-31 DIAGNOSIS — M79672 Pain in left foot: Secondary | ICD-10-CM | POA: Diagnosis not present

## 2020-08-31 DIAGNOSIS — E119 Type 2 diabetes mellitus without complications: Secondary | ICD-10-CM | POA: Diagnosis not present

## 2020-08-31 DIAGNOSIS — B351 Tinea unguium: Secondary | ICD-10-CM | POA: Diagnosis not present

## 2020-09-26 DIAGNOSIS — Z7984 Long term (current) use of oral hypoglycemic drugs: Secondary | ICD-10-CM | POA: Diagnosis not present

## 2020-09-26 DIAGNOSIS — E1142 Type 2 diabetes mellitus with diabetic polyneuropathy: Secondary | ICD-10-CM | POA: Diagnosis not present

## 2020-09-26 DIAGNOSIS — I10 Essential (primary) hypertension: Secondary | ICD-10-CM | POA: Diagnosis not present

## 2020-10-10 DIAGNOSIS — K219 Gastro-esophageal reflux disease without esophagitis: Secondary | ICD-10-CM | POA: Diagnosis not present

## 2020-10-10 DIAGNOSIS — Z7984 Long term (current) use of oral hypoglycemic drugs: Secondary | ICD-10-CM | POA: Diagnosis not present

## 2020-10-10 DIAGNOSIS — E1142 Type 2 diabetes mellitus with diabetic polyneuropathy: Secondary | ICD-10-CM | POA: Diagnosis not present

## 2020-10-10 DIAGNOSIS — I10 Essential (primary) hypertension: Secondary | ICD-10-CM | POA: Diagnosis not present

## 2020-10-10 DIAGNOSIS — E663 Overweight: Secondary | ICD-10-CM | POA: Diagnosis not present

## 2020-10-10 DIAGNOSIS — N529 Male erectile dysfunction, unspecified: Secondary | ICD-10-CM | POA: Diagnosis not present

## 2020-10-10 DIAGNOSIS — G8929 Other chronic pain: Secondary | ICD-10-CM | POA: Diagnosis not present

## 2020-10-10 DIAGNOSIS — E1165 Type 2 diabetes mellitus with hyperglycemia: Secondary | ICD-10-CM | POA: Diagnosis not present

## 2020-10-10 DIAGNOSIS — M1990 Unspecified osteoarthritis, unspecified site: Secondary | ICD-10-CM | POA: Diagnosis not present

## 2020-10-10 DIAGNOSIS — Z6829 Body mass index (BMI) 29.0-29.9, adult: Secondary | ICD-10-CM | POA: Diagnosis not present

## 2020-10-10 DIAGNOSIS — J309 Allergic rhinitis, unspecified: Secondary | ICD-10-CM | POA: Diagnosis not present

## 2020-10-10 DIAGNOSIS — G3184 Mild cognitive impairment, so stated: Secondary | ICD-10-CM | POA: Diagnosis not present

## 2020-11-30 DIAGNOSIS — B351 Tinea unguium: Secondary | ICD-10-CM | POA: Diagnosis not present

## 2020-11-30 DIAGNOSIS — L84 Corns and callosities: Secondary | ICD-10-CM | POA: Diagnosis not present

## 2020-11-30 DIAGNOSIS — M79671 Pain in right foot: Secondary | ICD-10-CM | POA: Diagnosis not present

## 2020-11-30 DIAGNOSIS — E119 Type 2 diabetes mellitus without complications: Secondary | ICD-10-CM | POA: Diagnosis not present

## 2020-11-30 DIAGNOSIS — M79672 Pain in left foot: Secondary | ICD-10-CM | POA: Diagnosis not present

## 2021-01-20 DIAGNOSIS — L03032 Cellulitis of left toe: Secondary | ICD-10-CM | POA: Diagnosis not present

## 2021-01-20 DIAGNOSIS — L600 Ingrowing nail: Secondary | ICD-10-CM | POA: Diagnosis not present

## 2021-02-10 DIAGNOSIS — M9903 Segmental and somatic dysfunction of lumbar region: Secondary | ICD-10-CM | POA: Diagnosis not present

## 2021-02-10 DIAGNOSIS — M5137 Other intervertebral disc degeneration, lumbosacral region: Secondary | ICD-10-CM | POA: Diagnosis not present

## 2021-02-10 DIAGNOSIS — M9902 Segmental and somatic dysfunction of thoracic region: Secondary | ICD-10-CM | POA: Diagnosis not present

## 2021-02-10 DIAGNOSIS — M9904 Segmental and somatic dysfunction of sacral region: Secondary | ICD-10-CM | POA: Diagnosis not present

## 2021-02-10 DIAGNOSIS — M9901 Segmental and somatic dysfunction of cervical region: Secondary | ICD-10-CM | POA: Diagnosis not present

## 2021-02-15 DIAGNOSIS — M5137 Other intervertebral disc degeneration, lumbosacral region: Secondary | ICD-10-CM | POA: Diagnosis not present

## 2021-02-15 DIAGNOSIS — M9902 Segmental and somatic dysfunction of thoracic region: Secondary | ICD-10-CM | POA: Diagnosis not present

## 2021-02-15 DIAGNOSIS — M9901 Segmental and somatic dysfunction of cervical region: Secondary | ICD-10-CM | POA: Diagnosis not present

## 2021-02-15 DIAGNOSIS — M9903 Segmental and somatic dysfunction of lumbar region: Secondary | ICD-10-CM | POA: Diagnosis not present

## 2021-02-15 DIAGNOSIS — M9904 Segmental and somatic dysfunction of sacral region: Secondary | ICD-10-CM | POA: Diagnosis not present

## 2021-02-22 DIAGNOSIS — M9903 Segmental and somatic dysfunction of lumbar region: Secondary | ICD-10-CM | POA: Diagnosis not present

## 2021-02-22 DIAGNOSIS — M9902 Segmental and somatic dysfunction of thoracic region: Secondary | ICD-10-CM | POA: Diagnosis not present

## 2021-02-22 DIAGNOSIS — M9904 Segmental and somatic dysfunction of sacral region: Secondary | ICD-10-CM | POA: Diagnosis not present

## 2021-02-22 DIAGNOSIS — M5137 Other intervertebral disc degeneration, lumbosacral region: Secondary | ICD-10-CM | POA: Diagnosis not present

## 2021-02-22 DIAGNOSIS — M9901 Segmental and somatic dysfunction of cervical region: Secondary | ICD-10-CM | POA: Diagnosis not present

## 2021-03-01 DIAGNOSIS — M5137 Other intervertebral disc degeneration, lumbosacral region: Secondary | ICD-10-CM | POA: Diagnosis not present

## 2021-03-01 DIAGNOSIS — M9904 Segmental and somatic dysfunction of sacral region: Secondary | ICD-10-CM | POA: Diagnosis not present

## 2021-03-01 DIAGNOSIS — M9903 Segmental and somatic dysfunction of lumbar region: Secondary | ICD-10-CM | POA: Diagnosis not present

## 2021-03-01 DIAGNOSIS — M9901 Segmental and somatic dysfunction of cervical region: Secondary | ICD-10-CM | POA: Diagnosis not present

## 2021-03-01 DIAGNOSIS — M9902 Segmental and somatic dysfunction of thoracic region: Secondary | ICD-10-CM | POA: Diagnosis not present

## 2021-03-08 DIAGNOSIS — M9901 Segmental and somatic dysfunction of cervical region: Secondary | ICD-10-CM | POA: Diagnosis not present

## 2021-03-08 DIAGNOSIS — M5137 Other intervertebral disc degeneration, lumbosacral region: Secondary | ICD-10-CM | POA: Diagnosis not present

## 2021-03-08 DIAGNOSIS — M9903 Segmental and somatic dysfunction of lumbar region: Secondary | ICD-10-CM | POA: Diagnosis not present

## 2021-03-08 DIAGNOSIS — M9904 Segmental and somatic dysfunction of sacral region: Secondary | ICD-10-CM | POA: Diagnosis not present

## 2021-03-08 DIAGNOSIS — M9902 Segmental and somatic dysfunction of thoracic region: Secondary | ICD-10-CM | POA: Diagnosis not present

## 2021-03-15 DIAGNOSIS — M9904 Segmental and somatic dysfunction of sacral region: Secondary | ICD-10-CM | POA: Diagnosis not present

## 2021-03-15 DIAGNOSIS — M9903 Segmental and somatic dysfunction of lumbar region: Secondary | ICD-10-CM | POA: Diagnosis not present

## 2021-03-15 DIAGNOSIS — M9902 Segmental and somatic dysfunction of thoracic region: Secondary | ICD-10-CM | POA: Diagnosis not present

## 2021-03-15 DIAGNOSIS — M9901 Segmental and somatic dysfunction of cervical region: Secondary | ICD-10-CM | POA: Diagnosis not present

## 2021-03-15 DIAGNOSIS — M5137 Other intervertebral disc degeneration, lumbosacral region: Secondary | ICD-10-CM | POA: Diagnosis not present

## 2021-03-20 DIAGNOSIS — E7800 Pure hypercholesterolemia, unspecified: Secondary | ICD-10-CM | POA: Diagnosis not present

## 2021-03-20 DIAGNOSIS — E1142 Type 2 diabetes mellitus with diabetic polyneuropathy: Secondary | ICD-10-CM | POA: Diagnosis not present

## 2021-03-20 DIAGNOSIS — Z7984 Long term (current) use of oral hypoglycemic drugs: Secondary | ICD-10-CM | POA: Diagnosis not present

## 2021-03-20 DIAGNOSIS — G629 Polyneuropathy, unspecified: Secondary | ICD-10-CM | POA: Diagnosis not present

## 2021-03-20 DIAGNOSIS — I10 Essential (primary) hypertension: Secondary | ICD-10-CM | POA: Diagnosis not present

## 2021-03-20 DIAGNOSIS — K222 Esophageal obstruction: Secondary | ICD-10-CM | POA: Diagnosis not present

## 2021-03-20 DIAGNOSIS — R1319 Other dysphagia: Secondary | ICD-10-CM | POA: Diagnosis not present

## 2021-03-20 DIAGNOSIS — R42 Dizziness and giddiness: Secondary | ICD-10-CM | POA: Diagnosis not present

## 2021-03-20 DIAGNOSIS — E663 Overweight: Secondary | ICD-10-CM | POA: Diagnosis not present

## 2021-03-20 DIAGNOSIS — Z125 Encounter for screening for malignant neoplasm of prostate: Secondary | ICD-10-CM | POA: Diagnosis not present

## 2021-03-20 DIAGNOSIS — Z6830 Body mass index (BMI) 30.0-30.9, adult: Secondary | ICD-10-CM | POA: Diagnosis not present

## 2021-03-20 DIAGNOSIS — Z0000 Encounter for general adult medical examination without abnormal findings: Secondary | ICD-10-CM | POA: Diagnosis not present

## 2021-03-23 DIAGNOSIS — M9901 Segmental and somatic dysfunction of cervical region: Secondary | ICD-10-CM | POA: Diagnosis not present

## 2021-03-23 DIAGNOSIS — M9904 Segmental and somatic dysfunction of sacral region: Secondary | ICD-10-CM | POA: Diagnosis not present

## 2021-03-23 DIAGNOSIS — M9903 Segmental and somatic dysfunction of lumbar region: Secondary | ICD-10-CM | POA: Diagnosis not present

## 2021-03-23 DIAGNOSIS — M9902 Segmental and somatic dysfunction of thoracic region: Secondary | ICD-10-CM | POA: Diagnosis not present

## 2021-03-23 DIAGNOSIS — M5137 Other intervertebral disc degeneration, lumbosacral region: Secondary | ICD-10-CM | POA: Diagnosis not present

## 2021-03-29 DIAGNOSIS — Z7984 Long term (current) use of oral hypoglycemic drugs: Secondary | ICD-10-CM | POA: Diagnosis not present

## 2021-03-29 DIAGNOSIS — M9902 Segmental and somatic dysfunction of thoracic region: Secondary | ICD-10-CM | POA: Diagnosis not present

## 2021-03-29 DIAGNOSIS — M9901 Segmental and somatic dysfunction of cervical region: Secondary | ICD-10-CM | POA: Diagnosis not present

## 2021-03-29 DIAGNOSIS — M9903 Segmental and somatic dysfunction of lumbar region: Secondary | ICD-10-CM | POA: Diagnosis not present

## 2021-03-29 DIAGNOSIS — M5137 Other intervertebral disc degeneration, lumbosacral region: Secondary | ICD-10-CM | POA: Diagnosis not present

## 2021-03-29 DIAGNOSIS — E1142 Type 2 diabetes mellitus with diabetic polyneuropathy: Secondary | ICD-10-CM | POA: Diagnosis not present

## 2021-03-29 DIAGNOSIS — I10 Essential (primary) hypertension: Secondary | ICD-10-CM | POA: Diagnosis not present

## 2021-03-29 DIAGNOSIS — M9904 Segmental and somatic dysfunction of sacral region: Secondary | ICD-10-CM | POA: Diagnosis not present

## 2021-04-05 DIAGNOSIS — M9902 Segmental and somatic dysfunction of thoracic region: Secondary | ICD-10-CM | POA: Diagnosis not present

## 2021-04-05 DIAGNOSIS — M9901 Segmental and somatic dysfunction of cervical region: Secondary | ICD-10-CM | POA: Diagnosis not present

## 2021-04-05 DIAGNOSIS — M9903 Segmental and somatic dysfunction of lumbar region: Secondary | ICD-10-CM | POA: Diagnosis not present

## 2021-04-05 DIAGNOSIS — M5137 Other intervertebral disc degeneration, lumbosacral region: Secondary | ICD-10-CM | POA: Diagnosis not present

## 2021-04-05 DIAGNOSIS — M9904 Segmental and somatic dysfunction of sacral region: Secondary | ICD-10-CM | POA: Diagnosis not present

## 2021-04-06 DIAGNOSIS — E119 Type 2 diabetes mellitus without complications: Secondary | ICD-10-CM | POA: Diagnosis not present

## 2021-04-06 DIAGNOSIS — I10 Essential (primary) hypertension: Secondary | ICD-10-CM | POA: Diagnosis not present

## 2021-04-06 DIAGNOSIS — Z0100 Encounter for examination of eyes and vision without abnormal findings: Secondary | ICD-10-CM | POA: Diagnosis not present

## 2021-04-06 DIAGNOSIS — H524 Presbyopia: Secondary | ICD-10-CM | POA: Diagnosis not present

## 2021-04-06 DIAGNOSIS — E7800 Pure hypercholesterolemia, unspecified: Secondary | ICD-10-CM | POA: Diagnosis not present

## 2021-04-12 DIAGNOSIS — M9901 Segmental and somatic dysfunction of cervical region: Secondary | ICD-10-CM | POA: Diagnosis not present

## 2021-04-12 DIAGNOSIS — M5137 Other intervertebral disc degeneration, lumbosacral region: Secondary | ICD-10-CM | POA: Diagnosis not present

## 2021-04-12 DIAGNOSIS — M9902 Segmental and somatic dysfunction of thoracic region: Secondary | ICD-10-CM | POA: Diagnosis not present

## 2021-04-12 DIAGNOSIS — M9903 Segmental and somatic dysfunction of lumbar region: Secondary | ICD-10-CM | POA: Diagnosis not present

## 2021-04-12 DIAGNOSIS — M9904 Segmental and somatic dysfunction of sacral region: Secondary | ICD-10-CM | POA: Diagnosis not present

## 2021-04-26 DIAGNOSIS — M9901 Segmental and somatic dysfunction of cervical region: Secondary | ICD-10-CM | POA: Diagnosis not present

## 2021-04-26 DIAGNOSIS — M9903 Segmental and somatic dysfunction of lumbar region: Secondary | ICD-10-CM | POA: Diagnosis not present

## 2021-04-26 DIAGNOSIS — M5137 Other intervertebral disc degeneration, lumbosacral region: Secondary | ICD-10-CM | POA: Diagnosis not present

## 2021-04-26 DIAGNOSIS — M9904 Segmental and somatic dysfunction of sacral region: Secondary | ICD-10-CM | POA: Diagnosis not present

## 2021-04-26 DIAGNOSIS — M9902 Segmental and somatic dysfunction of thoracic region: Secondary | ICD-10-CM | POA: Diagnosis not present

## 2021-05-03 DIAGNOSIS — M9902 Segmental and somatic dysfunction of thoracic region: Secondary | ICD-10-CM | POA: Diagnosis not present

## 2021-05-03 DIAGNOSIS — M5137 Other intervertebral disc degeneration, lumbosacral region: Secondary | ICD-10-CM | POA: Diagnosis not present

## 2021-05-03 DIAGNOSIS — M9903 Segmental and somatic dysfunction of lumbar region: Secondary | ICD-10-CM | POA: Diagnosis not present

## 2021-05-03 DIAGNOSIS — M9904 Segmental and somatic dysfunction of sacral region: Secondary | ICD-10-CM | POA: Diagnosis not present

## 2021-05-03 DIAGNOSIS — M9901 Segmental and somatic dysfunction of cervical region: Secondary | ICD-10-CM | POA: Diagnosis not present

## 2021-05-10 DIAGNOSIS — M9902 Segmental and somatic dysfunction of thoracic region: Secondary | ICD-10-CM | POA: Diagnosis not present

## 2021-05-10 DIAGNOSIS — M5137 Other intervertebral disc degeneration, lumbosacral region: Secondary | ICD-10-CM | POA: Diagnosis not present

## 2021-05-10 DIAGNOSIS — M9904 Segmental and somatic dysfunction of sacral region: Secondary | ICD-10-CM | POA: Diagnosis not present

## 2021-05-10 DIAGNOSIS — M9903 Segmental and somatic dysfunction of lumbar region: Secondary | ICD-10-CM | POA: Diagnosis not present

## 2021-05-10 DIAGNOSIS — M9901 Segmental and somatic dysfunction of cervical region: Secondary | ICD-10-CM | POA: Diagnosis not present

## 2021-05-17 DIAGNOSIS — M9901 Segmental and somatic dysfunction of cervical region: Secondary | ICD-10-CM | POA: Diagnosis not present

## 2021-05-17 DIAGNOSIS — M9903 Segmental and somatic dysfunction of lumbar region: Secondary | ICD-10-CM | POA: Diagnosis not present

## 2021-05-17 DIAGNOSIS — M9902 Segmental and somatic dysfunction of thoracic region: Secondary | ICD-10-CM | POA: Diagnosis not present

## 2021-05-17 DIAGNOSIS — M9904 Segmental and somatic dysfunction of sacral region: Secondary | ICD-10-CM | POA: Diagnosis not present

## 2021-05-17 DIAGNOSIS — M5137 Other intervertebral disc degeneration, lumbosacral region: Secondary | ICD-10-CM | POA: Diagnosis not present

## 2021-05-24 DIAGNOSIS — M9904 Segmental and somatic dysfunction of sacral region: Secondary | ICD-10-CM | POA: Diagnosis not present

## 2021-05-24 DIAGNOSIS — M9901 Segmental and somatic dysfunction of cervical region: Secondary | ICD-10-CM | POA: Diagnosis not present

## 2021-05-24 DIAGNOSIS — M9902 Segmental and somatic dysfunction of thoracic region: Secondary | ICD-10-CM | POA: Diagnosis not present

## 2021-05-24 DIAGNOSIS — M5137 Other intervertebral disc degeneration, lumbosacral region: Secondary | ICD-10-CM | POA: Diagnosis not present

## 2021-05-24 DIAGNOSIS — M9903 Segmental and somatic dysfunction of lumbar region: Secondary | ICD-10-CM | POA: Diagnosis not present

## 2021-05-31 DIAGNOSIS — M9902 Segmental and somatic dysfunction of thoracic region: Secondary | ICD-10-CM | POA: Diagnosis not present

## 2021-05-31 DIAGNOSIS — M9903 Segmental and somatic dysfunction of lumbar region: Secondary | ICD-10-CM | POA: Diagnosis not present

## 2021-05-31 DIAGNOSIS — M5137 Other intervertebral disc degeneration, lumbosacral region: Secondary | ICD-10-CM | POA: Diagnosis not present

## 2021-05-31 DIAGNOSIS — M9901 Segmental and somatic dysfunction of cervical region: Secondary | ICD-10-CM | POA: Diagnosis not present

## 2021-05-31 DIAGNOSIS — M9904 Segmental and somatic dysfunction of sacral region: Secondary | ICD-10-CM | POA: Diagnosis not present

## 2021-06-07 DIAGNOSIS — M9903 Segmental and somatic dysfunction of lumbar region: Secondary | ICD-10-CM | POA: Diagnosis not present

## 2021-06-07 DIAGNOSIS — M9904 Segmental and somatic dysfunction of sacral region: Secondary | ICD-10-CM | POA: Diagnosis not present

## 2021-06-07 DIAGNOSIS — M5137 Other intervertebral disc degeneration, lumbosacral region: Secondary | ICD-10-CM | POA: Diagnosis not present

## 2021-06-07 DIAGNOSIS — M9901 Segmental and somatic dysfunction of cervical region: Secondary | ICD-10-CM | POA: Diagnosis not present

## 2021-06-07 DIAGNOSIS — M9902 Segmental and somatic dysfunction of thoracic region: Secondary | ICD-10-CM | POA: Diagnosis not present

## 2021-06-14 DIAGNOSIS — M9903 Segmental and somatic dysfunction of lumbar region: Secondary | ICD-10-CM | POA: Diagnosis not present

## 2021-06-14 DIAGNOSIS — M9901 Segmental and somatic dysfunction of cervical region: Secondary | ICD-10-CM | POA: Diagnosis not present

## 2021-06-14 DIAGNOSIS — M9902 Segmental and somatic dysfunction of thoracic region: Secondary | ICD-10-CM | POA: Diagnosis not present

## 2021-06-14 DIAGNOSIS — M5137 Other intervertebral disc degeneration, lumbosacral region: Secondary | ICD-10-CM | POA: Diagnosis not present

## 2021-06-14 DIAGNOSIS — M9904 Segmental and somatic dysfunction of sacral region: Secondary | ICD-10-CM | POA: Diagnosis not present

## 2021-06-29 DIAGNOSIS — M9901 Segmental and somatic dysfunction of cervical region: Secondary | ICD-10-CM | POA: Diagnosis not present

## 2021-06-29 DIAGNOSIS — M9903 Segmental and somatic dysfunction of lumbar region: Secondary | ICD-10-CM | POA: Diagnosis not present

## 2021-06-29 DIAGNOSIS — M9902 Segmental and somatic dysfunction of thoracic region: Secondary | ICD-10-CM | POA: Diagnosis not present

## 2021-06-29 DIAGNOSIS — M5137 Other intervertebral disc degeneration, lumbosacral region: Secondary | ICD-10-CM | POA: Diagnosis not present

## 2021-06-29 DIAGNOSIS — M9904 Segmental and somatic dysfunction of sacral region: Secondary | ICD-10-CM | POA: Diagnosis not present

## 2021-07-05 DIAGNOSIS — M9901 Segmental and somatic dysfunction of cervical region: Secondary | ICD-10-CM | POA: Diagnosis not present

## 2021-07-05 DIAGNOSIS — M9904 Segmental and somatic dysfunction of sacral region: Secondary | ICD-10-CM | POA: Diagnosis not present

## 2021-07-05 DIAGNOSIS — M5137 Other intervertebral disc degeneration, lumbosacral region: Secondary | ICD-10-CM | POA: Diagnosis not present

## 2021-07-05 DIAGNOSIS — M9902 Segmental and somatic dysfunction of thoracic region: Secondary | ICD-10-CM | POA: Diagnosis not present

## 2021-07-05 DIAGNOSIS — M9903 Segmental and somatic dysfunction of lumbar region: Secondary | ICD-10-CM | POA: Diagnosis not present

## 2021-07-13 DIAGNOSIS — M9902 Segmental and somatic dysfunction of thoracic region: Secondary | ICD-10-CM | POA: Diagnosis not present

## 2021-07-13 DIAGNOSIS — M9901 Segmental and somatic dysfunction of cervical region: Secondary | ICD-10-CM | POA: Diagnosis not present

## 2021-07-13 DIAGNOSIS — M9904 Segmental and somatic dysfunction of sacral region: Secondary | ICD-10-CM | POA: Diagnosis not present

## 2021-07-13 DIAGNOSIS — M5137 Other intervertebral disc degeneration, lumbosacral region: Secondary | ICD-10-CM | POA: Diagnosis not present

## 2021-07-13 DIAGNOSIS — M9903 Segmental and somatic dysfunction of lumbar region: Secondary | ICD-10-CM | POA: Diagnosis not present

## 2021-07-20 DIAGNOSIS — M9903 Segmental and somatic dysfunction of lumbar region: Secondary | ICD-10-CM | POA: Diagnosis not present

## 2021-07-20 DIAGNOSIS — M5137 Other intervertebral disc degeneration, lumbosacral region: Secondary | ICD-10-CM | POA: Diagnosis not present

## 2021-07-20 DIAGNOSIS — M9904 Segmental and somatic dysfunction of sacral region: Secondary | ICD-10-CM | POA: Diagnosis not present

## 2021-07-20 DIAGNOSIS — M9901 Segmental and somatic dysfunction of cervical region: Secondary | ICD-10-CM | POA: Diagnosis not present

## 2021-07-20 DIAGNOSIS — M9902 Segmental and somatic dysfunction of thoracic region: Secondary | ICD-10-CM | POA: Diagnosis not present

## 2021-07-26 DIAGNOSIS — M9903 Segmental and somatic dysfunction of lumbar region: Secondary | ICD-10-CM | POA: Diagnosis not present

## 2021-07-26 DIAGNOSIS — M9904 Segmental and somatic dysfunction of sacral region: Secondary | ICD-10-CM | POA: Diagnosis not present

## 2021-07-26 DIAGNOSIS — M9902 Segmental and somatic dysfunction of thoracic region: Secondary | ICD-10-CM | POA: Diagnosis not present

## 2021-07-26 DIAGNOSIS — M5137 Other intervertebral disc degeneration, lumbosacral region: Secondary | ICD-10-CM | POA: Diagnosis not present

## 2021-07-26 DIAGNOSIS — M9901 Segmental and somatic dysfunction of cervical region: Secondary | ICD-10-CM | POA: Diagnosis not present

## 2021-08-02 DIAGNOSIS — M5137 Other intervertebral disc degeneration, lumbosacral region: Secondary | ICD-10-CM | POA: Diagnosis not present

## 2021-08-02 DIAGNOSIS — M9901 Segmental and somatic dysfunction of cervical region: Secondary | ICD-10-CM | POA: Diagnosis not present

## 2021-08-02 DIAGNOSIS — M9902 Segmental and somatic dysfunction of thoracic region: Secondary | ICD-10-CM | POA: Diagnosis not present

## 2021-08-02 DIAGNOSIS — M9904 Segmental and somatic dysfunction of sacral region: Secondary | ICD-10-CM | POA: Diagnosis not present

## 2021-08-02 DIAGNOSIS — M9903 Segmental and somatic dysfunction of lumbar region: Secondary | ICD-10-CM | POA: Diagnosis not present

## 2021-08-09 DIAGNOSIS — M9904 Segmental and somatic dysfunction of sacral region: Secondary | ICD-10-CM | POA: Diagnosis not present

## 2021-08-09 DIAGNOSIS — M5137 Other intervertebral disc degeneration, lumbosacral region: Secondary | ICD-10-CM | POA: Diagnosis not present

## 2021-08-09 DIAGNOSIS — M9903 Segmental and somatic dysfunction of lumbar region: Secondary | ICD-10-CM | POA: Diagnosis not present

## 2021-08-09 DIAGNOSIS — M9902 Segmental and somatic dysfunction of thoracic region: Secondary | ICD-10-CM | POA: Diagnosis not present

## 2021-08-09 DIAGNOSIS — M9901 Segmental and somatic dysfunction of cervical region: Secondary | ICD-10-CM | POA: Diagnosis not present

## 2021-08-16 DIAGNOSIS — M9902 Segmental and somatic dysfunction of thoracic region: Secondary | ICD-10-CM | POA: Diagnosis not present

## 2021-08-16 DIAGNOSIS — M9901 Segmental and somatic dysfunction of cervical region: Secondary | ICD-10-CM | POA: Diagnosis not present

## 2021-08-16 DIAGNOSIS — M5137 Other intervertebral disc degeneration, lumbosacral region: Secondary | ICD-10-CM | POA: Diagnosis not present

## 2021-08-16 DIAGNOSIS — M9903 Segmental and somatic dysfunction of lumbar region: Secondary | ICD-10-CM | POA: Diagnosis not present

## 2021-08-16 DIAGNOSIS — M9904 Segmental and somatic dysfunction of sacral region: Secondary | ICD-10-CM | POA: Diagnosis not present

## 2021-08-23 DIAGNOSIS — M9903 Segmental and somatic dysfunction of lumbar region: Secondary | ICD-10-CM | POA: Diagnosis not present

## 2021-08-23 DIAGNOSIS — M9902 Segmental and somatic dysfunction of thoracic region: Secondary | ICD-10-CM | POA: Diagnosis not present

## 2021-08-23 DIAGNOSIS — M9901 Segmental and somatic dysfunction of cervical region: Secondary | ICD-10-CM | POA: Diagnosis not present

## 2021-08-23 DIAGNOSIS — M5137 Other intervertebral disc degeneration, lumbosacral region: Secondary | ICD-10-CM | POA: Diagnosis not present

## 2021-08-23 DIAGNOSIS — M9904 Segmental and somatic dysfunction of sacral region: Secondary | ICD-10-CM | POA: Diagnosis not present

## 2021-09-06 DIAGNOSIS — M9903 Segmental and somatic dysfunction of lumbar region: Secondary | ICD-10-CM | POA: Diagnosis not present

## 2021-09-06 DIAGNOSIS — M9901 Segmental and somatic dysfunction of cervical region: Secondary | ICD-10-CM | POA: Diagnosis not present

## 2021-09-06 DIAGNOSIS — M9904 Segmental and somatic dysfunction of sacral region: Secondary | ICD-10-CM | POA: Diagnosis not present

## 2021-09-06 DIAGNOSIS — M9902 Segmental and somatic dysfunction of thoracic region: Secondary | ICD-10-CM | POA: Diagnosis not present

## 2021-09-06 DIAGNOSIS — M5137 Other intervertebral disc degeneration, lumbosacral region: Secondary | ICD-10-CM | POA: Diagnosis not present

## 2021-09-11 DIAGNOSIS — M9901 Segmental and somatic dysfunction of cervical region: Secondary | ICD-10-CM | POA: Diagnosis not present

## 2021-09-11 DIAGNOSIS — M9902 Segmental and somatic dysfunction of thoracic region: Secondary | ICD-10-CM | POA: Diagnosis not present

## 2021-09-11 DIAGNOSIS — M9904 Segmental and somatic dysfunction of sacral region: Secondary | ICD-10-CM | POA: Diagnosis not present

## 2021-09-11 DIAGNOSIS — M9903 Segmental and somatic dysfunction of lumbar region: Secondary | ICD-10-CM | POA: Diagnosis not present

## 2021-09-11 DIAGNOSIS — M5137 Other intervertebral disc degeneration, lumbosacral region: Secondary | ICD-10-CM | POA: Diagnosis not present

## 2021-09-26 ENCOUNTER — Emergency Department (HOSPITAL_BASED_OUTPATIENT_CLINIC_OR_DEPARTMENT_OTHER): Payer: Medicare HMO

## 2021-09-26 ENCOUNTER — Encounter (HOSPITAL_BASED_OUTPATIENT_CLINIC_OR_DEPARTMENT_OTHER): Payer: Self-pay | Admitting: Emergency Medicine

## 2021-09-26 ENCOUNTER — Emergency Department (HOSPITAL_BASED_OUTPATIENT_CLINIC_OR_DEPARTMENT_OTHER)
Admission: EM | Admit: 2021-09-26 | Discharge: 2021-09-26 | Disposition: A | Discharge status: Home / Self Care | Payer: Medicare HMO | Attending: Emergency Medicine | Admitting: Emergency Medicine

## 2021-09-26 DIAGNOSIS — R002 Palpitations: Secondary | ICD-10-CM

## 2021-09-26 DIAGNOSIS — E119 Type 2 diabetes mellitus without complications: Secondary | ICD-10-CM | POA: Diagnosis not present

## 2021-09-26 DIAGNOSIS — R0600 Dyspnea, unspecified: Secondary | ICD-10-CM | POA: Diagnosis not present

## 2021-09-26 DIAGNOSIS — Z79899 Other long term (current) drug therapy: Secondary | ICD-10-CM | POA: Diagnosis not present

## 2021-09-26 DIAGNOSIS — R059 Cough, unspecified: Secondary | ICD-10-CM | POA: Insufficient documentation

## 2021-09-26 DIAGNOSIS — R0789 Other chest pain: Secondary | ICD-10-CM

## 2021-09-26 DIAGNOSIS — T501X5A Adverse effect of loop [high-ceiling] diuretics, initial encounter: Secondary | ICD-10-CM | POA: Diagnosis not present

## 2021-09-26 DIAGNOSIS — I10 Essential (primary) hypertension: Secondary | ICD-10-CM | POA: Diagnosis not present

## 2021-09-26 DIAGNOSIS — N289 Disorder of kidney and ureter, unspecified: Secondary | ICD-10-CM

## 2021-09-26 DIAGNOSIS — Z7984 Long term (current) use of oral hypoglycemic drugs: Secondary | ICD-10-CM | POA: Diagnosis not present

## 2021-09-26 DIAGNOSIS — R0602 Shortness of breath: Secondary | ICD-10-CM | POA: Diagnosis not present

## 2021-09-26 DIAGNOSIS — E876 Hypokalemia: Secondary | ICD-10-CM | POA: Diagnosis not present

## 2021-09-26 LAB — COMPREHENSIVE METABOLIC PANEL
ALT: 16 U/L (ref 0–44)
AST: 22 U/L (ref 15–41)
Albumin: 3.9 g/dL (ref 3.5–5.0)
Alkaline Phosphatase: 64 U/L (ref 38–126)
Anion gap: 7 (ref 5–15)
BUN: 20 mg/dL (ref 8–23)
CO2: 27 mmol/L (ref 22–32)
Calcium: 9 mg/dL (ref 8.9–10.3)
Chloride: 104 mmol/L (ref 98–111)
Creatinine, Ser: 1.43 mg/dL — ABNORMAL HIGH (ref 0.61–1.24)
GFR, Estimated: 50 mL/min — ABNORMAL LOW (ref >60)
Glucose, Bld: 135 mg/dL — ABNORMAL HIGH (ref 70–99)
Potassium: 2.9 mmol/L — ABNORMAL LOW (ref 3.5–5.1)
Sodium: 138 mmol/L (ref 135–145)
Total Bilirubin: 0.6 mg/dL (ref 0.3–1.2)
Total Protein: 7.4 g/dL (ref 6.5–8.1)

## 2021-09-26 LAB — CBC WITH DIFFERENTIAL/PLATELET
Abs Immature Granulocytes: 0.04 10*3/uL (ref 0.00–0.07)
Basophils Absolute: 0.1 10*3/uL (ref 0.0–0.1)
Basophils Relative: 1 %
Eosinophils Absolute: 0.2 10*3/uL (ref 0.0–0.5)
Eosinophils Relative: 2 %
HCT: 38.2 % — ABNORMAL LOW (ref 39.0–52.0)
Hemoglobin: 13.1 g/dL (ref 13.0–17.0)
Immature Granulocytes: 0 %
Lymphocytes Relative: 46 %
Lymphs Abs: 5.9 10*3/uL — ABNORMAL HIGH (ref 0.7–4.0)
MCH: 32.8 pg (ref 26.0–34.0)
MCHC: 34.3 g/dL (ref 30.0–36.0)
MCV: 95.5 fL (ref 80.0–100.0)
Monocytes Absolute: 0.8 10*3/uL (ref 0.1–1.0)
Monocytes Relative: 6 %
Neutro Abs: 5.6 10*3/uL (ref 1.7–7.7)
Neutrophils Relative %: 45 %
Platelets: 198 10*3/uL (ref 150–400)
RBC: 4 MIL/uL — ABNORMAL LOW (ref 4.22–5.81)
RDW: 12.1 % (ref 11.5–15.5)
WBC: 12.5 10*3/uL — ABNORMAL HIGH (ref 4.0–10.5)
nRBC: 0 % (ref 0.0–0.2)

## 2021-09-26 LAB — TROPONIN I (HIGH SENSITIVITY)
Troponin I (High Sensitivity): 3 ng/L (ref <18)
Troponin I (High Sensitivity): 4 ng/L (ref <18)

## 2021-09-26 LAB — MAGNESIUM: Magnesium: 1.9 mg/dL (ref 1.7–2.4)

## 2021-09-26 MED ORDER — POTASSIUM CHLORIDE CRYS ER 20 MEQ PO TBCR
40.0000 meq | EXTENDED_RELEASE_TABLET | Freq: Once | ORAL | Status: AC
  Administered 2021-09-26: 40 meq via ORAL
  Filled 2021-09-26: qty 2

## 2021-09-26 MED ORDER — ASPIRIN 81 MG PO CHEW
324.0000 mg | CHEWABLE_TABLET | Freq: Once | ORAL | Status: AC
  Administered 2021-09-26: 324 mg via ORAL
  Filled 2021-09-26: qty 4

## 2021-09-26 MED ORDER — POTASSIUM CHLORIDE CRYS ER 20 MEQ PO TBCR: EXTENDED_RELEASE_TABLET | ORAL | 0 refills | Status: DC

## 2021-09-26 MED ORDER — POTASSIUM CHLORIDE 10 MEQ/100ML IV SOLN
10.0000 meq | Freq: Once | INTRAVENOUS | Status: AC
Start: 2021-09-26 — End: 2021-09-26
  Administered 2021-09-26: 10 meq via INTRAVENOUS
  Filled 2021-09-26: qty 100

## 2021-09-26 MED ORDER — MAGNESIUM SULFATE 2 GM/50ML IV SOLN
2.0000 g | Freq: Once | INTRAVENOUS | Status: AC
  Administered 2021-09-26: 2 g via INTRAVENOUS
  Filled 2021-09-26: qty 50

## 2021-09-26 NOTE — ED Provider Notes (Signed)
?[facility] EMERGENCY DEPARTMENT ?Provider Note ? ? ?CSN: [id number] ?Arrival date & time: 09/26/21  0256 ? ?  ? ?History ? ?Chief Complaint  ?Patient presents with  ? Shortness of Breath  ? ? ?Thomas Rangel is a 81 y.o. male. ? ?The history is provided by the patient.  ?Shortness of Breath ?He has history of hypertension, diabetes and comes in because of a nervous feeling around his heart.  He has noticed this mainly at night for the last 3 or 4 nights.  There has been minimal dyspnea but no nausea or diaphoresis.  He did not denies any weakness or decrease in stamina.  He has taken aspirin 81 mg which seem to give some slight, temporary relief.  He denies fever or chills.  There has been minimal cough.  He denies arthralgias or myalgias. ?  ?Home Medications ?Prior to Admission medications   ?Medication Sig Start Date End Date Taking? Authorizing Provider  ?atorvastatin (LIPITOR) 10 MG tablet Take 10 mg by mouth every morning.    [provider]  ?diltiazem (CARDIZEM CD) 180 MG 24 hr capsule Take 180 mg by mouth daily. 04/26/17   [provider]  ?fluconazole (DIFLUCAN) 100 MG tablet Take 1 tablet (100 mg total) by mouth daily. 05/04/17   Hosie Poisson, MD  ?glimepiride (AMARYL) 2 MG tablet Take 2 mg by mouth daily with breakfast.    [provider]  ?hydrochlorothiazide (HYDRODIURIL) 25 MG tablet Take 25 mg by mouth daily.    [provider]  ?lisinopril (PRINIVIL,ZESTRIL) 30 MG tablet Take 30 mg by mouth daily.    [provider]  ?omega-3 acid ethyl esters (LOVAZA) 1 g capsule Take 2 g by mouth 2 (two) times daily.    [provider]  ?pioglitazone (ACTOS) 30 MG tablet Take 30 mg by mouth daily.    [provider]  ?   ? ?Allergies    ?Simvastatin, Metformin, and Penicillins   ? ?Review of Systems   ?Review of Systems  ?Respiratory:  Positive for shortness of breath.   ?All other systems reviewed and are negative. ? ?Physical Exam ?Updated  Vital Signs ?BP (!) 153/72   Pulse 68   Temp 98.6 ?F (37 ?C) (Oral)   Resp 15   Ht 5\' 9"  (1.753 m)   Wt 92 kg   SpO2 100%   BMI 29.95 kg/m?  ?Physical Exam ?Vitals and nursing note reviewed.  ?81 year old male, resting comfortably and in no acute distress. Vital signs are significant for mildly elevated blood pressure. Oxygen saturation is 100%, which is normal. ?Head is normocephalic and atraumatic. PERRLA, EOMI. Oropharynx is clear. ?Neck is nontender and supple without adenopathy or JVD. ?Back is nontender and there is no CVA tenderness. ?Lungs are clear without rales, wheezes, or rhonchi. ?Chest is nontender. ?Heart has regular rate and rhythm without murmur. ?Abdomen is soft, flat, nontender. ?Extremities have no cyanosis or edema, full range of motion is present. ?Skin is warm and dry without rash. ?Neurologic: Mental status is normal, cranial nerves are intact, moves all extremities equally. ? ?ED Results / Procedures / Treatments   ?Labs ?(all labs ordered are listed, but only abnormal results are displayed) ?Labs Reviewed - No data to display ? ?EKG ?EKG Interpretation ? ?Date/Time:  Tuesday Sep 26 2021 03:03:58 EDT ?Ventricular Rate:  81 ?PR Interval:  163 ?QRS Duration: 104 ?QT Interval:  400 ?QTC Calculation: 465 ?R Axis:   -16 ?Text Interpretation: Sinus rhythm LAD, consider  left anterior fascicular block Low voltage, precordial leads Abnormal R-wave progression, late transition No old tracing to compare Confirmed by Delora Fuel ([id number]) on 09/26/2021 3:12:34 AM ? ?Radiology ?No results found. ? ?Procedures ?Procedures  ?Cardiac monitor shows normal sinus rhythm, per my interpretation. ? ?Medications Ordered in ED ?Medications - No data to display ? ?ED Course/ Medical Decision Making/ A&P ?  ?                        ?Medical Decision Making ?Amount and/or Complexity of Data Reviewed ?Labs: ordered. ?Radiology: ordered. ? ?Risk ?OTC drugs. ?Prescription drug management. ? ? ?Vague chest discomfort  of uncertain cause.  His description is very atypical, but certainly need to consider possibility of angina equivalent.  No risk factors to suggest pulmonary embolism.  Minimal cough, will get chest x-ray to rule out pneumonia.  Will check electrolytes as well as troponin x2.  ECG is obtained and shows no ST or T changes.  Old records are reviewed, and he has no relevant past visits, no prior cardiac work-up. ? ?Labs show significant hypokalemia with potassium 2.9.  Magnesium level was checked and is technically normal but on the lower end of normal.  He is given intravenous potassium and magnesium, oral potassium.  He is noted to be on a diuretic which is likely the cause for his hypokalemia.  Hypokalemia may account for his symptoms.  Troponin is normal x2.  He is also noted to have mild renal insufficiency which is unchanged from most recent value noted in care everywhere on 03/20/2021.  He is referred to cardiology for further outpatient work-up, consideration for outpatient cardiac monitoring. ? ?Final Clinical Impression(s) / ED Diagnoses ?Final diagnoses:  ?Chest discomfort  ?Diuretic-induced hypokalemia  ?Renal insufficiency  ?Palpitations  ? ? ?Rx / DC Orders ?ED Discharge Orders   ? ?      Ordered  ?  potassium chloride SA (KLOR-CON M) 20 MEQ tablet       ? 09/26/21 0716  ?  Ambulatory referral to Cardiology       ?Comments: If you have not heard from the Cardiology office within the next 72 hours please call [phone].  ? 09/26/21 0717  ? ?  ?  ? ?  ? ? ?  ?Delora Fuel, MD ?[id number] [id number] ? ?

## 2021-09-26 NOTE — ED Notes (Signed)
Patient transported to X-ray 

## 2021-09-26 NOTE — ED Triage Notes (Signed)
Pt states he feels "nervous", intermittent shob. Denies n/v, fever, chill. Reports occasional cough.  ?

## 2021-09-27 LAB — PATHOLOGIST SMEAR REVIEW

## 2021-10-04 DIAGNOSIS — I10 Essential (primary) hypertension: Secondary | ICD-10-CM | POA: Diagnosis not present

## 2021-10-04 DIAGNOSIS — M9903 Segmental and somatic dysfunction of lumbar region: Secondary | ICD-10-CM | POA: Diagnosis not present

## 2021-10-04 DIAGNOSIS — M5137 Other intervertebral disc degeneration, lumbosacral region: Secondary | ICD-10-CM | POA: Diagnosis not present

## 2021-10-04 DIAGNOSIS — Z7984 Long term (current) use of oral hypoglycemic drugs: Secondary | ICD-10-CM | POA: Diagnosis not present

## 2021-10-04 DIAGNOSIS — M9901 Segmental and somatic dysfunction of cervical region: Secondary | ICD-10-CM | POA: Diagnosis not present

## 2021-10-04 DIAGNOSIS — M9902 Segmental and somatic dysfunction of thoracic region: Secondary | ICD-10-CM | POA: Diagnosis not present

## 2021-10-04 DIAGNOSIS — E1142 Type 2 diabetes mellitus with diabetic polyneuropathy: Secondary | ICD-10-CM | POA: Diagnosis not present

## 2021-10-04 DIAGNOSIS — M9904 Segmental and somatic dysfunction of sacral region: Secondary | ICD-10-CM | POA: Diagnosis not present

## 2021-10-11 DIAGNOSIS — M9903 Segmental and somatic dysfunction of lumbar region: Secondary | ICD-10-CM | POA: Diagnosis not present

## 2021-10-11 DIAGNOSIS — M5137 Other intervertebral disc degeneration, lumbosacral region: Secondary | ICD-10-CM | POA: Diagnosis not present

## 2021-10-11 DIAGNOSIS — M9902 Segmental and somatic dysfunction of thoracic region: Secondary | ICD-10-CM | POA: Diagnosis not present

## 2021-10-11 DIAGNOSIS — M9904 Segmental and somatic dysfunction of sacral region: Secondary | ICD-10-CM | POA: Diagnosis not present

## 2021-10-11 DIAGNOSIS — M9901 Segmental and somatic dysfunction of cervical region: Secondary | ICD-10-CM | POA: Diagnosis not present

## 2021-10-18 DIAGNOSIS — M9903 Segmental and somatic dysfunction of lumbar region: Secondary | ICD-10-CM | POA: Diagnosis not present

## 2021-10-18 DIAGNOSIS — M9904 Segmental and somatic dysfunction of sacral region: Secondary | ICD-10-CM | POA: Diagnosis not present

## 2021-10-18 DIAGNOSIS — M5137 Other intervertebral disc degeneration, lumbosacral region: Secondary | ICD-10-CM | POA: Diagnosis not present

## 2021-10-18 DIAGNOSIS — M9901 Segmental and somatic dysfunction of cervical region: Secondary | ICD-10-CM | POA: Diagnosis not present

## 2021-10-18 DIAGNOSIS — M9902 Segmental and somatic dysfunction of thoracic region: Secondary | ICD-10-CM | POA: Diagnosis not present

## 2021-10-25 DIAGNOSIS — M9902 Segmental and somatic dysfunction of thoracic region: Secondary | ICD-10-CM | POA: Diagnosis not present

## 2021-10-25 DIAGNOSIS — M9901 Segmental and somatic dysfunction of cervical region: Secondary | ICD-10-CM | POA: Diagnosis not present

## 2021-10-25 DIAGNOSIS — M9904 Segmental and somatic dysfunction of sacral region: Secondary | ICD-10-CM | POA: Diagnosis not present

## 2021-10-25 DIAGNOSIS — M5137 Other intervertebral disc degeneration, lumbosacral region: Secondary | ICD-10-CM | POA: Diagnosis not present

## 2021-10-25 DIAGNOSIS — M9903 Segmental and somatic dysfunction of lumbar region: Secondary | ICD-10-CM | POA: Diagnosis not present

## 2021-11-01 DIAGNOSIS — M9904 Segmental and somatic dysfunction of sacral region: Secondary | ICD-10-CM | POA: Diagnosis not present

## 2021-11-01 DIAGNOSIS — M9901 Segmental and somatic dysfunction of cervical region: Secondary | ICD-10-CM | POA: Diagnosis not present

## 2021-11-01 DIAGNOSIS — M9903 Segmental and somatic dysfunction of lumbar region: Secondary | ICD-10-CM | POA: Diagnosis not present

## 2021-11-01 DIAGNOSIS — M5137 Other intervertebral disc degeneration, lumbosacral region: Secondary | ICD-10-CM | POA: Diagnosis not present

## 2021-11-01 DIAGNOSIS — M9902 Segmental and somatic dysfunction of thoracic region: Secondary | ICD-10-CM | POA: Diagnosis not present

## 2021-11-06 NOTE — Progress Notes (Signed)
Cardiology Office Note:    Date:  11/07/2021   ID:  Thomas Rangel, DOB September 13, 1940, MRN [id number]  PCP:  [facility]  Cardiologist:  Norman Herrlich, MD   Referring MD: Dione Booze, MD  ASSESSMENT:    1. Chest discomfort   2. Primary hypertension   3. Hypokalemia    PLAN:    In order of problems listed above:  Recently presented to the hospital with nonanginal symptoms am not sure if he could even use the word pain and not really palpitation either.  The emergency room suggest he wear a monitor organ to do that but I told him I suspect is going to be reassuring.  Looking backwards I think his predominant problem was hypokalemia and he may have been having arrhythmia at the time he went to the hospital.  I think he is best to stop hydrochlorothiazide blood pressure at home runs in the range of 120-130/70-80 and he does not require thiazide diuretic.  His potassium has been repleted if his monitor is reassuring I will see him back in the office as needed  Next appointment as needed   Medication Adjustments/Labs and Tests Ordered: Current medicines are reviewed at length with the patient today.  Concerns regarding medicines are outlined above.  No orders of the defined types were placed in this encounter.  No orders of the defined types were placed in this encounter. Chief complaint follow-up from ED visit  History of Present Illness:    Thomas Rangel is a 81 y.o. male who is being seen today for the evaluation of chest discomfort at the request of Dione Booze, MD.  Seen  at [facility] ED 09/26/2021 with chest discomfort described as a nervous feeling about his heart with some shortness of breath.  His high-sensitivity troponin was low with 3 chest x-ray showed no abnormality CBC with a hemoglobin of 13.1 CMP with a creatinine 1.43 GFR 50 cc his serum potassium is severely diminished at 2.9 and his EKG showed sinus rhythm left axis deviation left anterior  hemiblock.  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 he has no exercise intolerance edema shortness of breath or chest pain He has no known history of heart disease congenital or rheumatic He has had no recurrence Past Medical History:  Diagnosis Date   Diabetes mellitus without complication (HCC)    Diverticulosis: Per colonoscopy 05/02/2017 05/02/2017   DM type 2 (diabetes mellitus, type 2) (HCC) 04/30/2017   HTN (hypertension) 04/30/2017   Hypertension     Past Surgical History:  Procedure Laterality Date   COLONOSCOPY WITH PROPOFOL N/A 05/02/2017   Procedure: COLONOSCOPY WITH PROPOFOL;  Surgeon: Willis Modena, MD;  Location: [facility] ENDOSCOPY;  Service: Endoscopy;  Laterality: N/A;   ESOPHAGOGASTRODUODENOSCOPY (EGD) WITH PROPOFOL Left 05/01/2017   Procedure: ESOPHAGOGASTRODUODENOSCOPY (EGD) WITH PROPOFOL;  Surgeon: Willis Modena, MD;  Location: [facility] ENDOSCOPY;  Service: Endoscopy;  Laterality: Left;    Current Medications: Current Meds  Medication Sig  acetaminophen (TYLENOL 8 HOUR ARTHRITIS PAIN) 650 MG CR tablet Take 650 mg by mouth every 8 (eight) hours as needed for pain.   atorvastatin (LIPITOR) 10 MG tablet Take 10 mg by mouth every morning.   Cod Liver Oil 1000 MG CAPS Take 1 capsule by mouth at bedtime.   diltiazem (CARDIZEM CD) 180 MG 24 hr capsule Take 180 mg by mouth daily.   esomeprazole ([employer] ESOMEPRAZOLE MAGNESIUM) 20 MG capsule Take 20 mg by mouth daily at 12 noon.   fluconazole (DIFLUCAN) 100 MG tablet  Take 1 tablet (100 mg total) by mouth daily.   glimepiride (AMARYL) 2 MG tablet Take 2 mg by mouth daily with breakfast.   lisinopril (PRINIVIL,ZESTRIL) 30 MG tablet Take 30 mg by mouth daily.   omega-3 acid ethyl esters (LOVAZA) 1 g capsule Take 2 g by mouth 2 (two) times daily.   pioglitazone (ACTOS) 30 MG tablet Take 30 mg by mouth daily.   potassium chloride SA (KLOR-CON M) 20 MEQ tablet Take 1 tablet twice a day for the next 10 days. After that, take 1 tablet daily.   [DISCONTINUED] hydrochlorothiazide (HYDRODIURIL) 25 MG tablet Take 25 mg by mouth daily.     Allergies:   Simvastatin, Metformin, and Penicillins   Social History   Socioeconomic History   Marital status: Single    Spouse name: Not on file   Number of children: Not on file   Years of education: Not on file   Highest education level: Not on file  Occupational History   Not on file  Tobacco Use   Smoking status: Never    Passive exposure: Past   Smokeless tobacco: Never  Vaping Use   Vaping Use: Never used  Substance and Sexual Activity   Alcohol use: No   Drug use: No   Sexual activity: Not on file  Other Topics Concern   Not on file  Social History Narrative   Not on file   Social Determinants of Health   Financial Resource Strain: Not on file  Food Insecurity: Not on file  Transportation Needs: Not on file  Physical Activity: Not on file  Stress: Not on file  Social Connections: Not on file     Family History: The patient's family history includes CVA in his father and mother.  ROS:   ROS Please see the history of present illness.     All other systems reviewed and are negative.  EKGs/Labs/Other Studies Reviewed:    The following studies were reviewed today:  Recent Labs: 09/26/2021: ALT 16; BUN 20; Creatinine, Ser 1.43; Hemoglobin 13.1; Magnesium 1.9; Platelets 198; Potassium 2.9; Sodium 138  Recent Lipid Panel    Component Value Date/Time   CHOL 120 05/01/2017 0214   TRIG 114  05/01/2017 0214   HDL 34 (L) 05/01/2017 0214   CHOLHDL 3.5 05/01/2017 0214   VLDL 23 05/01/2017 0214   LDLCALC 63 05/01/2017 0214    Physical Exam:    VS:  BP (!) 156/76 (BP Location: Left Arm, Patient Position: Sitting)   Pulse 68   Ht 5\' 9"  (1.753 m)   Wt 201 lb (91.2 kg)   SpO2 98%   BMI 29.68 kg/m     Wt Readings from Last 3 Encounters:  11/07/21 201 lb (91.2 kg)  09/26/21 202 lb 12.8 oz (92 kg)  05/03/17 206 lb 8 oz (93.7 kg)     GEN: Looks younger than his age well nourished, well developed in no acute distress HEENT: Normal  NECK: No JVD; No carotid bruits LYMPHATICS: No lymphadenopathy CARDIAC: RRR, no murmurs, rubs, gallops RESPIRATORY:  Clear to auscultation without rales, wheezing or rhonchi  ABDOMEN: Soft, non-tender, non-distended MUSCULOSKELETAL:  No edema; No deformity  SKIN: Warm and dry NEUROLOGIC:  Alert and oriented x 3 PSYCHIATRIC:  Normal affect     Signed, Norman HerrlichBrian Mayleigh Tetrault, MD  11/07/2021 2:10 PM    [facility]

## 2021-11-07 ENCOUNTER — Encounter: Payer: Self-pay | Admitting: Cardiology

## 2021-11-07 ENCOUNTER — Ambulatory Visit: Payer: Medicare HMO | Admitting: Cardiology

## 2021-11-07 ENCOUNTER — Ambulatory Visit (INDEPENDENT_AMBULATORY_CARE_PROVIDER_SITE_OTHER): Payer: Medicare HMO

## 2021-11-07 VITALS — BP 156/76 | HR 68 | Ht 69.0 in | Wt 201.0 lb

## 2021-11-07 DIAGNOSIS — I10 Essential (primary) hypertension: Secondary | ICD-10-CM

## 2021-11-07 DIAGNOSIS — R0789 Other chest pain: Secondary | ICD-10-CM

## 2021-11-07 DIAGNOSIS — E876 Hypokalemia: Secondary | ICD-10-CM

## 2021-11-07 DIAGNOSIS — R002 Palpitations: Secondary | ICD-10-CM | POA: Diagnosis not present

## 2021-11-07 NOTE — Patient Instructions (Addendum)
Medication Instructions:  Your physician has recommended you make the following change in your medication:   STOP: Hydrochlorothiazide  *If you need a refill on your cardiac medications before your next appointment, please call your pharmacy*   Lab Work: None If you have labs (blood work) drawn today and your tests are completely normal, you will receive your results only by: MyChart Message (if you have MyChart) OR A paper copy in the mail If you have any lab test that is abnormal or we need to change your treatment, we will call you to review the results.   Testing/Procedures: A zio monitor was ordered today. It will remain on for 7 days. You will then return monitor and event diary in provided box. It takes 1-2 weeks for report to be downloaded and returned to [place]. We will call you with the results. If monitor falls off or has orange flashing light, please call Zio for further instructions.    Follow-Up: At [facility], you and your health needs are our priority.  As part of our continuing mission to provide you with exceptional heart care, we have created designated Provider Care Teams.  These Care Teams include your primary Cardiologist (physician) and Advanced Practice Providers (APPs -  Physician Assistants and Nurse Practitioners) who all work together to provide you with the care you need, when you need it.  We recommend signing up for the patient portal called "MyChart".  Sign up information is provided on this After Visit Summary.  MyChart is used to connect with patients for Virtual Visits (Telemedicine).  Patients are able to view lab/test results, encounter notes, upcoming appointments, etc.  Non-urgent messages can be sent to your provider as well.   To learn more about what you can do with MyChart, go to [email or web address].    Your next appointment:   Follow up as needed  The format for your next appointment:   In Person  Provider:   Norman Herrlich, MD     Other Instructions None  Important Information About Sugar

## 2021-11-08 DIAGNOSIS — M5137 Other intervertebral disc degeneration, lumbosacral region: Secondary | ICD-10-CM | POA: Diagnosis not present

## 2021-11-08 DIAGNOSIS — M9901 Segmental and somatic dysfunction of cervical region: Secondary | ICD-10-CM | POA: Diagnosis not present

## 2021-11-08 DIAGNOSIS — M9904 Segmental and somatic dysfunction of sacral region: Secondary | ICD-10-CM | POA: Diagnosis not present

## 2021-11-08 DIAGNOSIS — M9903 Segmental and somatic dysfunction of lumbar region: Secondary | ICD-10-CM | POA: Diagnosis not present

## 2021-11-08 DIAGNOSIS — M9902 Segmental and somatic dysfunction of thoracic region: Secondary | ICD-10-CM | POA: Diagnosis not present

## 2021-11-15 DIAGNOSIS — M9903 Segmental and somatic dysfunction of lumbar region: Secondary | ICD-10-CM | POA: Diagnosis not present

## 2021-11-15 DIAGNOSIS — M9904 Segmental and somatic dysfunction of sacral region: Secondary | ICD-10-CM | POA: Diagnosis not present

## 2021-11-15 DIAGNOSIS — M5137 Other intervertebral disc degeneration, lumbosacral region: Secondary | ICD-10-CM | POA: Diagnosis not present

## 2021-11-15 DIAGNOSIS — M9901 Segmental and somatic dysfunction of cervical region: Secondary | ICD-10-CM | POA: Diagnosis not present

## 2021-11-15 DIAGNOSIS — M9902 Segmental and somatic dysfunction of thoracic region: Secondary | ICD-10-CM | POA: Diagnosis not present

## 2021-11-16 DIAGNOSIS — E876 Hypokalemia: Secondary | ICD-10-CM | POA: Diagnosis not present

## 2021-11-16 DIAGNOSIS — I10 Essential (primary) hypertension: Secondary | ICD-10-CM | POA: Diagnosis not present

## 2021-11-16 DIAGNOSIS — R0789 Other chest pain: Secondary | ICD-10-CM | POA: Diagnosis not present

## 2021-11-21 ENCOUNTER — Other Ambulatory Visit: Payer: Self-pay

## 2021-11-21 MED ORDER — METOPROLOL SUCCINATE ER 25 MG PO TB24: 12.5000 mg | ORAL_TABLET | Freq: Every day | ORAL | 3 refills | Status: DC

## 2021-11-22 DIAGNOSIS — M9902 Segmental and somatic dysfunction of thoracic region: Secondary | ICD-10-CM | POA: Diagnosis not present

## 2021-11-22 DIAGNOSIS — M9904 Segmental and somatic dysfunction of sacral region: Secondary | ICD-10-CM | POA: Diagnosis not present

## 2021-11-22 DIAGNOSIS — M9903 Segmental and somatic dysfunction of lumbar region: Secondary | ICD-10-CM | POA: Diagnosis not present

## 2021-11-22 DIAGNOSIS — M5137 Other intervertebral disc degeneration, lumbosacral region: Secondary | ICD-10-CM | POA: Diagnosis not present

## 2021-11-22 DIAGNOSIS — M9901 Segmental and somatic dysfunction of cervical region: Secondary | ICD-10-CM | POA: Diagnosis not present

## 2021-11-29 DIAGNOSIS — M9903 Segmental and somatic dysfunction of lumbar region: Secondary | ICD-10-CM | POA: Diagnosis not present

## 2021-11-29 DIAGNOSIS — M5137 Other intervertebral disc degeneration, lumbosacral region: Secondary | ICD-10-CM | POA: Diagnosis not present

## 2021-11-29 DIAGNOSIS — M9901 Segmental and somatic dysfunction of cervical region: Secondary | ICD-10-CM | POA: Diagnosis not present

## 2021-11-29 DIAGNOSIS — M9902 Segmental and somatic dysfunction of thoracic region: Secondary | ICD-10-CM | POA: Diagnosis not present

## 2021-11-29 DIAGNOSIS — M9904 Segmental and somatic dysfunction of sacral region: Secondary | ICD-10-CM | POA: Diagnosis not present

## 2021-12-01 ENCOUNTER — Telehealth: Payer: Self-pay | Admitting: Cardiology

## 2021-12-01 NOTE — Telephone Encounter (Signed)
Called patient and he reported that he had swelling in his feet and ankles. His last office visit on 6/13 his weight was 202. As of Monday (7/3) his weight was 208. His latest blood pressure on Monday was 138/72 per the patient, he did not have a heart rate to report. Per the patient, during his last office visit with Dr. Dulce Sellar he was taken off his fluid pill. He reports no chest pain or shortness of breath, only swelling in his lower extremities.I will send this to Dr. Tomie China for guidance.

## 2021-12-01 NOTE — Telephone Encounter (Signed)
Pt c/o swelling: STAT is pt has developed SOB within 24 hours  If swelling, where is the swelling located? Feet and ankles  How much weight have you gained and in what time span? 202 last ov  now weight was 208 on Monday  Have you gained 3 pounds in a day or 5 pounds in a week?   Do you have a log of your daily weights (if so, list)? no  Are you currently taking a fluid pill? No  Are you currently SOB? no  Have you traveled recently? No- patient wanted to be seen today- I made him an appointment on Monday with the PA [facility]. Please call to advise

## 2021-12-03 NOTE — Progress Notes (Unsigned)
Office Visit    Patient Name: Thomas Rangel Date of Encounter: 12/03/2021  PCP:  [facility]   [facility]  Cardiologist:  Norman Herrlich, MD  Advanced Practice Provider:  No care team member to display Electrophysiologist:  None    Chief Complaint    Thomas Rangel is a 81 y.o. male with a hx of diabetes mellitus without complication, diverticulosis, hypertension, history of chest pain presents today for follow-up appointment.  He was seen at [facility] ED 09/26/2021 with chest discomfort described as a nervous feeling about his heart and some shortness of breath.  His at bedtime troponin was low.  EKG showed sinus rhythm with left axis deviation left anterior hemiblock.  He was seen in the ER by cardiology.  He did not lose consciousness.  He explained that he was nervous in his chest and it frightened him at the time.  He continues to work at [employer] and had no exercise intolerance, edema, shortness of breath, or chest pain.  He had no recurrence of the pain.  A ZIO monitor was placed for 7 days to rule out any arrhythmia.  Results showed brief episodes of rapid tachycardia.  This was ultimately thought to be what caused him to go to the emergency room.  He was started on Toprol XL 12 and half milligrams daily.  Today, he ***  Past Medical History    Past Medical History:  Diagnosis Date   Diabetes mellitus without complication (HCC)    Diverticulosis: Per colonoscopy 05/02/2017 05/02/2017   DM type 2 (diabetes mellitus, type 2) (HCC) 04/30/2017   HTN (hypertension) 04/30/2017   Hypertension    Past Surgical History:  Procedure Laterality Date   COLONOSCOPY WITH PROPOFOL N/A 05/02/2017   Procedure: COLONOSCOPY WITH PROPOFOL;  Surgeon: Willis Modena, MD;  Location: [facility] ENDOSCOPY;  Service: Endoscopy;  Laterality: N/A;   ESOPHAGOGASTRODUODENOSCOPY (EGD) WITH PROPOFOL Left 05/01/2017   Procedure: ESOPHAGOGASTRODUODENOSCOPY  (EGD) WITH PROPOFOL;  Surgeon: Willis Modena, MD;  Location: [facility];  Service: Endoscopy;  Laterality: Left;    Allergies  Allergies  Allergen Reactions   Simvastatin     Other reaction(s): Myalgias (intolerance), Other (See Comments)   Metformin Nausea Only    Other reaction(s): GI Upset (intolerance)   Penicillins Rash     EKGs/Labs/Other Studies Reviewed:   The following studies were reviewed today:  Monitor results 11/17/2021  Patch Wear Time:  6 days and 23 hours (2023-06-13T14:22:12-398 to 2023-06-20T13:29:03-399)   Patient had a min HR of 44 bpm, max HR of 207 bpm, and avg HR of 67 bpm. Predominant underlying rhythm was Sinus Rhythm.    1 run of Ventricular Tachycardia occurred lasting 4 beats with a max rate of 128 bpm (avg 114 bpm).    31 Supraventricular Tachycardia runs occurred, the run with the fastest interval lasting 6 beats with a max rate of 207 bpm, the longest lasting 18.3 secs with an avg rate of 97 bpm. Some episodes of Supraventricular Tachycardia may be possible Atrial Tachycardia with variable block.  The average heart rate of SVT was 135 bpm.   Supraventricular Tachycardia was detected within +/- 45 seconds of symptomatic patient event(s). Isolated SVEs were rare (<1.0%), SVE Couplets were rare (<1.0%), and SVE Triplets were rare (<1.0%).    There were 59 triggered events mostly sinus rhythm.  Often associated with single atrial and ventricular premature contractions.  In particular associated with brief episodes of SVT.  Isolated VEs were rare (<1.0%, 172), VE Triplets were rare (<1.0%, 1), and no VE Couplets were present.  EKG:  EKG is *** ordered today.  The ekg ordered today demonstrates ***  Recent Labs: 09/26/2021: ALT 16; BUN 20; Creatinine, Ser 1.43; Hemoglobin 13.1; Magnesium 1.9; Platelets 198; Potassium 2.9; Sodium 138  Recent Lipid Panel    Component Value Date/Time   CHOL 120 05/01/2017 0214   TRIG 114 05/01/2017 0214   HDL 34  (L) 05/01/2017 0214   CHOLHDL 3.5 05/01/2017 0214   VLDL 23 05/01/2017 0214   LDLCALC 63 05/01/2017 0214    Risk Assessment/Calculations:  {Does this patient have ATRIAL FIBRILLATION?:[phone]}  Home Medications   No outpatient medications have been marked as taking for the 12/04/21 encounter (Appointment) with Sharlene Dory, PA-C.     Review of Systems   ***   All other systems reviewed and are otherwise negative except as noted above.  Physical Exam    VS:  There were no vitals taken for this visit. , BMI There is no height or weight on file to calculate BMI.  Wt Readings from Last 3 Encounters:  11/07/21 201 lb (91.2 kg)  09/26/21 202 lb 12.8 oz (92 kg)  05/03/17 206 lb 8 oz (93.7 kg)     GEN: Well nourished, well developed, in no acute distress. HEENT: normal. Neck: Supple, no JVD, carotid bruits, or masses. Cardiac: ***RRR, no murmurs, rubs, or gallops. No clubbing, cyanosis, edema.  ***Radials/PT 2+ and equal bilaterally.  Respiratory:  ***Respirations regular and unlabored, clear to auscultation bilaterally. GI: Soft, nontender, nondistended. MS: No deformity or atrophy. Skin: Warm and dry, no rash. Neuro:  Strength and sensation are intact. Psych: Normal affect.  Assessment & Plan    Chest discomfort  Palpitations  Hypertension  Hypokalemia     Disposition: Follow up {follow up:15908} with Norman Herrlich, MD or APP.  Signed, Sharlene Dory, PA-C 12/03/2021, 12:15 PM [facility]

## 2021-12-04 ENCOUNTER — Ambulatory Visit: Payer: Medicare HMO | Admitting: Physician Assistant

## 2021-12-04 ENCOUNTER — Encounter: Payer: Self-pay | Admitting: Physician Assistant

## 2021-12-04 VITALS — BP 158/80 | HR 70 | Ht 66.5 in | Wt 206.6 lb

## 2021-12-04 DIAGNOSIS — R002 Palpitations: Secondary | ICD-10-CM

## 2021-12-04 DIAGNOSIS — R0789 Other chest pain: Secondary | ICD-10-CM

## 2021-12-04 DIAGNOSIS — I10 Essential (primary) hypertension: Secondary | ICD-10-CM | POA: Diagnosis not present

## 2021-12-04 DIAGNOSIS — E876 Hypokalemia: Secondary | ICD-10-CM

## 2021-12-04 DIAGNOSIS — R600 Localized edema: Secondary | ICD-10-CM | POA: Diagnosis not present

## 2021-12-04 LAB — BASIC METABOLIC PANEL
BUN/Creatinine Ratio: 9 — ABNORMAL LOW (ref 10–24)
BUN: 11 mg/dL (ref 8–27)
CO2: 25 mmol/L (ref 20–29)
Calcium: 9.5 mg/dL (ref 8.6–10.2)
Chloride: 107 mmol/L — ABNORMAL HIGH (ref 96–106)
Creatinine, Ser: 1.2 mg/dL (ref 0.76–1.27)
Glucose: 95 mg/dL (ref 70–99)
Potassium: 4 mmol/L (ref 3.5–5.2)
Sodium: 143 mmol/L (ref 134–144)
eGFR: 61 mL/min/{1.73_m2} (ref >59)

## 2021-12-04 LAB — MAGNESIUM: Magnesium: 2 mg/dL (ref 1.6–2.3)

## 2021-12-04 MED ORDER — POTASSIUM CHLORIDE ER 10 MEQ PO TBCR
EXTENDED_RELEASE_TABLET | ORAL | 0 refills | Status: DC
Start: 2021-12-04 — End: 2021-12-27

## 2021-12-04 MED ORDER — FUROSEMIDE 20 MG PO TABS: ORAL_TABLET | ORAL | 0 refills | Status: DC

## 2021-12-04 NOTE — Patient Instructions (Signed)
Medication Instructions:  1.Start furosemide (Lasix) 20 mg, take one tablet daily for the next 3 days then take as needed for lower extremity edema thereafter 2.Start potassium 10 meq, take one tablet daily for the next 3 days then take as needed when you have to take the lasix thereafter *If you need a refill on your cardiac medications before your next appointment, please call your pharmacy*   Lab Work: BMP and Mag today BMP in one week If you have labs (blood work) drawn today and your tests are completely normal, you will receive your results only by: MyChart Message (if you have MyChart) OR A paper copy in the mail If you have any lab test that is abnormal or we need to change your treatment, we will call you to review the results.   Testing/Procedures: Your physician has requested that you have an echocardiogram. Echocardiography is a painless test that uses sound waves to create images of your heart. It provides your doctor with information about the size and shape of your heart and how well your heart's chambers and valves are working. This procedure takes approximately one hour. There are no restrictions for this procedure.    Follow-Up: At [facility], you and your health needs are our priority.  As part of our continuing mission to provide you with exceptional heart care, we have created designated Provider Care Teams.  These Care Teams include your primary Cardiologist (physician) and Advanced Practice Providers (APPs -  Physician Assistants and Nurse Practitioners) who all work together to provide you with the care you need, when you need it.  We recommend signing up for the patient portal called "MyChart".  Sign up information is provided on this After Visit Summary.  MyChart is used to connect with patients for Virtual Visits (Telemedicine).  Patients are able to view lab/test results, encounter notes, upcoming appointments, etc.  Non-urgent messages can be sent to your  provider as well.   To learn more about what you can do with MyChart, go to [email or web address].    Your next appointment:   6 month(s)  The format for your next appointment:   In Person  Provider:   Dr Dulce Sellar or Jari Favre PA-C   Important Information About Sugar

## 2021-12-05 NOTE — Progress Notes (Signed)
Spoke with patient and provided him with his lab results. He stated his understanding and appreciation for the call.

## 2021-12-06 DIAGNOSIS — M5137 Other intervertebral disc degeneration, lumbosacral region: Secondary | ICD-10-CM | POA: Diagnosis not present

## 2021-12-06 DIAGNOSIS — M9904 Segmental and somatic dysfunction of sacral region: Secondary | ICD-10-CM | POA: Diagnosis not present

## 2021-12-06 DIAGNOSIS — M9903 Segmental and somatic dysfunction of lumbar region: Secondary | ICD-10-CM | POA: Diagnosis not present

## 2021-12-06 DIAGNOSIS — M9901 Segmental and somatic dysfunction of cervical region: Secondary | ICD-10-CM | POA: Diagnosis not present

## 2021-12-06 DIAGNOSIS — M9902 Segmental and somatic dysfunction of thoracic region: Secondary | ICD-10-CM | POA: Diagnosis not present

## 2021-12-06 NOTE — Telephone Encounter (Signed)
Called patient and they were able to address his concerns regarding the swelling in his lower extremities and his weight gain at his appointment on 7/10 with Jari Favre. Patient had no further questions at this time.

## 2021-12-12 ENCOUNTER — Other Ambulatory Visit: Payer: Medicare HMO

## 2021-12-12 ENCOUNTER — Ambulatory Visit (HOSPITAL_COMMUNITY): Payer: Medicare HMO | Attending: Cardiology

## 2021-12-12 DIAGNOSIS — R002 Palpitations: Secondary | ICD-10-CM | POA: Diagnosis not present

## 2021-12-12 DIAGNOSIS — R600 Localized edema: Secondary | ICD-10-CM | POA: Diagnosis not present

## 2021-12-12 DIAGNOSIS — I10 Essential (primary) hypertension: Secondary | ICD-10-CM

## 2021-12-12 DIAGNOSIS — R0789 Other chest pain: Secondary | ICD-10-CM | POA: Diagnosis not present

## 2021-12-12 DIAGNOSIS — E876 Hypokalemia: Secondary | ICD-10-CM

## 2021-12-12 LAB — ECHOCARDIOGRAM COMPLETE
Area-P 1/2: 3.85 cm2
S' Lateral: 2.7 cm

## 2021-12-13 LAB — BASIC METABOLIC PANEL
BUN/Creatinine Ratio: 10 (ref 10–24)
BUN: 13 mg/dL (ref 8–27)
CO2: 23 mmol/L (ref 20–29)
Calcium: 9.1 mg/dL (ref 8.6–10.2)
Chloride: 106 mmol/L (ref 96–106)
Creatinine, Ser: 1.29 mg/dL — ABNORMAL HIGH (ref 0.76–1.27)
Glucose: 161 mg/dL — ABNORMAL HIGH (ref 70–99)
Potassium: 4.2 mmol/L (ref 3.5–5.2)
Sodium: 142 mmol/L (ref 134–144)
eGFR: 56 mL/min/{1.73_m2} — ABNORMAL LOW (ref >59)

## 2021-12-14 DIAGNOSIS — E119 Type 2 diabetes mellitus without complications: Secondary | ICD-10-CM | POA: Diagnosis not present

## 2021-12-14 DIAGNOSIS — M7671 Peroneal tendinitis, right leg: Secondary | ICD-10-CM | POA: Diagnosis not present

## 2021-12-14 DIAGNOSIS — M25571 Pain in right ankle and joints of right foot: Secondary | ICD-10-CM | POA: Diagnosis not present

## 2021-12-15 ENCOUNTER — Telehealth: Payer: Self-pay | Admitting: Cardiology

## 2021-12-15 DIAGNOSIS — M9901 Segmental and somatic dysfunction of cervical region: Secondary | ICD-10-CM | POA: Diagnosis not present

## 2021-12-15 DIAGNOSIS — M9904 Segmental and somatic dysfunction of sacral region: Secondary | ICD-10-CM | POA: Diagnosis not present

## 2021-12-15 DIAGNOSIS — M5137 Other intervertebral disc degeneration, lumbosacral region: Secondary | ICD-10-CM | POA: Diagnosis not present

## 2021-12-15 DIAGNOSIS — M9903 Segmental and somatic dysfunction of lumbar region: Secondary | ICD-10-CM | POA: Diagnosis not present

## 2021-12-15 DIAGNOSIS — M9902 Segmental and somatic dysfunction of thoracic region: Secondary | ICD-10-CM | POA: Diagnosis not present

## 2021-12-15 NOTE — Telephone Encounter (Signed)
Patient was returning call for results. Please advise °

## 2021-12-15 NOTE — Telephone Encounter (Signed)
Spoke with pt and advised of results per Jari Favre, PA-C as below.  Pt verbalizes understanding and thanked Charity fundraiser for the call back.   Your kidney function is not normal but consistent with whats its been the last 4 years. Your glucose was elevated even for not fasting so please watch your sugars. Otherwise labs are normal.  You have a normal heart pump function. No regional wall motion abnormalities, and no significant valvular disease. Good result!   Let me know if you have questions. Thanks   Sharlene Dory, PA-C

## 2021-12-20 DIAGNOSIS — M9902 Segmental and somatic dysfunction of thoracic region: Secondary | ICD-10-CM | POA: Diagnosis not present

## 2021-12-20 DIAGNOSIS — M9901 Segmental and somatic dysfunction of cervical region: Secondary | ICD-10-CM | POA: Diagnosis not present

## 2021-12-20 DIAGNOSIS — M9904 Segmental and somatic dysfunction of sacral region: Secondary | ICD-10-CM | POA: Diagnosis not present

## 2021-12-20 DIAGNOSIS — M5137 Other intervertebral disc degeneration, lumbosacral region: Secondary | ICD-10-CM | POA: Diagnosis not present

## 2021-12-20 DIAGNOSIS — M9903 Segmental and somatic dysfunction of lumbar region: Secondary | ICD-10-CM | POA: Diagnosis not present

## 2021-12-26 ENCOUNTER — Other Ambulatory Visit: Payer: Self-pay | Admitting: Physician Assistant

## 2021-12-27 ENCOUNTER — Other Ambulatory Visit: Payer: Self-pay

## 2021-12-27 DIAGNOSIS — M9903 Segmental and somatic dysfunction of lumbar region: Secondary | ICD-10-CM | POA: Diagnosis not present

## 2021-12-27 DIAGNOSIS — M5137 Other intervertebral disc degeneration, lumbosacral region: Secondary | ICD-10-CM | POA: Diagnosis not present

## 2021-12-27 DIAGNOSIS — M9901 Segmental and somatic dysfunction of cervical region: Secondary | ICD-10-CM | POA: Diagnosis not present

## 2021-12-27 DIAGNOSIS — M9904 Segmental and somatic dysfunction of sacral region: Secondary | ICD-10-CM | POA: Diagnosis not present

## 2021-12-27 DIAGNOSIS — M9902 Segmental and somatic dysfunction of thoracic region: Secondary | ICD-10-CM | POA: Diagnosis not present

## 2021-12-27 MED ORDER — POTASSIUM CHLORIDE ER 10 MEQ PO TBCR: EXTENDED_RELEASE_TABLET | ORAL | 0 refills | Status: DC

## 2021-12-27 MED ORDER — FUROSEMIDE 20 MG PO TABS: ORAL_TABLET | ORAL | 3 refills | Status: DC

## 2022-01-03 DIAGNOSIS — M9903 Segmental and somatic dysfunction of lumbar region: Secondary | ICD-10-CM | POA: Diagnosis not present

## 2022-01-03 DIAGNOSIS — M5137 Other intervertebral disc degeneration, lumbosacral region: Secondary | ICD-10-CM | POA: Diagnosis not present

## 2022-01-03 DIAGNOSIS — M9904 Segmental and somatic dysfunction of sacral region: Secondary | ICD-10-CM | POA: Diagnosis not present

## 2022-01-03 DIAGNOSIS — M9902 Segmental and somatic dysfunction of thoracic region: Secondary | ICD-10-CM | POA: Diagnosis not present

## 2022-01-03 DIAGNOSIS — M9901 Segmental and somatic dysfunction of cervical region: Secondary | ICD-10-CM | POA: Diagnosis not present

## 2022-01-10 DIAGNOSIS — M9901 Segmental and somatic dysfunction of cervical region: Secondary | ICD-10-CM | POA: Diagnosis not present

## 2022-01-10 DIAGNOSIS — M9904 Segmental and somatic dysfunction of sacral region: Secondary | ICD-10-CM | POA: Diagnosis not present

## 2022-01-10 DIAGNOSIS — M5137 Other intervertebral disc degeneration, lumbosacral region: Secondary | ICD-10-CM | POA: Diagnosis not present

## 2022-01-10 DIAGNOSIS — M9902 Segmental and somatic dysfunction of thoracic region: Secondary | ICD-10-CM | POA: Diagnosis not present

## 2022-01-10 DIAGNOSIS — M9903 Segmental and somatic dysfunction of lumbar region: Secondary | ICD-10-CM | POA: Diagnosis not present

## 2022-01-22 ENCOUNTER — Other Ambulatory Visit: Payer: Self-pay | Admitting: Physician Assistant

## 2022-01-24 DIAGNOSIS — M9901 Segmental and somatic dysfunction of cervical region: Secondary | ICD-10-CM | POA: Diagnosis not present

## 2022-01-24 DIAGNOSIS — M9904 Segmental and somatic dysfunction of sacral region: Secondary | ICD-10-CM | POA: Diagnosis not present

## 2022-01-24 DIAGNOSIS — M9902 Segmental and somatic dysfunction of thoracic region: Secondary | ICD-10-CM | POA: Diagnosis not present

## 2022-01-24 DIAGNOSIS — M5137 Other intervertebral disc degeneration, lumbosacral region: Secondary | ICD-10-CM | POA: Diagnosis not present

## 2022-01-24 DIAGNOSIS — M9903 Segmental and somatic dysfunction of lumbar region: Secondary | ICD-10-CM | POA: Diagnosis not present

## 2022-01-31 DIAGNOSIS — M9903 Segmental and somatic dysfunction of lumbar region: Secondary | ICD-10-CM | POA: Diagnosis not present

## 2022-01-31 DIAGNOSIS — M5137 Other intervertebral disc degeneration, lumbosacral region: Secondary | ICD-10-CM | POA: Diagnosis not present

## 2022-01-31 DIAGNOSIS — M9904 Segmental and somatic dysfunction of sacral region: Secondary | ICD-10-CM | POA: Diagnosis not present

## 2022-01-31 DIAGNOSIS — M9901 Segmental and somatic dysfunction of cervical region: Secondary | ICD-10-CM | POA: Diagnosis not present

## 2022-01-31 DIAGNOSIS — M9902 Segmental and somatic dysfunction of thoracic region: Secondary | ICD-10-CM | POA: Diagnosis not present

## 2022-02-07 DIAGNOSIS — M5137 Other intervertebral disc degeneration, lumbosacral region: Secondary | ICD-10-CM | POA: Diagnosis not present

## 2022-02-07 DIAGNOSIS — M9904 Segmental and somatic dysfunction of sacral region: Secondary | ICD-10-CM | POA: Diagnosis not present

## 2022-02-07 DIAGNOSIS — M9902 Segmental and somatic dysfunction of thoracic region: Secondary | ICD-10-CM | POA: Diagnosis not present

## 2022-02-07 DIAGNOSIS — M9901 Segmental and somatic dysfunction of cervical region: Secondary | ICD-10-CM | POA: Diagnosis not present

## 2022-02-07 DIAGNOSIS — M9903 Segmental and somatic dysfunction of lumbar region: Secondary | ICD-10-CM | POA: Diagnosis not present

## 2022-02-14 DIAGNOSIS — M9904 Segmental and somatic dysfunction of sacral region: Secondary | ICD-10-CM | POA: Diagnosis not present

## 2022-02-14 DIAGNOSIS — M9902 Segmental and somatic dysfunction of thoracic region: Secondary | ICD-10-CM | POA: Diagnosis not present

## 2022-02-14 DIAGNOSIS — M9903 Segmental and somatic dysfunction of lumbar region: Secondary | ICD-10-CM | POA: Diagnosis not present

## 2022-02-14 DIAGNOSIS — M5137 Other intervertebral disc degeneration, lumbosacral region: Secondary | ICD-10-CM | POA: Diagnosis not present

## 2022-02-14 DIAGNOSIS — M9901 Segmental and somatic dysfunction of cervical region: Secondary | ICD-10-CM | POA: Diagnosis not present

## 2022-02-28 DIAGNOSIS — M9903 Segmental and somatic dysfunction of lumbar region: Secondary | ICD-10-CM | POA: Diagnosis not present

## 2022-02-28 DIAGNOSIS — M9902 Segmental and somatic dysfunction of thoracic region: Secondary | ICD-10-CM | POA: Diagnosis not present

## 2022-02-28 DIAGNOSIS — M5137 Other intervertebral disc degeneration, lumbosacral region: Secondary | ICD-10-CM | POA: Diagnosis not present

## 2022-02-28 DIAGNOSIS — M9901 Segmental and somatic dysfunction of cervical region: Secondary | ICD-10-CM | POA: Diagnosis not present

## 2022-02-28 DIAGNOSIS — M9904 Segmental and somatic dysfunction of sacral region: Secondary | ICD-10-CM | POA: Diagnosis not present

## 2022-03-07 DIAGNOSIS — M5137 Other intervertebral disc degeneration, lumbosacral region: Secondary | ICD-10-CM | POA: Diagnosis not present

## 2022-03-07 DIAGNOSIS — M79672 Pain in left foot: Secondary | ICD-10-CM | POA: Diagnosis not present

## 2022-03-07 DIAGNOSIS — M9904 Segmental and somatic dysfunction of sacral region: Secondary | ICD-10-CM | POA: Diagnosis not present

## 2022-03-07 DIAGNOSIS — M9902 Segmental and somatic dysfunction of thoracic region: Secondary | ICD-10-CM | POA: Diagnosis not present

## 2022-03-07 DIAGNOSIS — M9901 Segmental and somatic dysfunction of cervical region: Secondary | ICD-10-CM | POA: Diagnosis not present

## 2022-03-07 DIAGNOSIS — M9903 Segmental and somatic dysfunction of lumbar region: Secondary | ICD-10-CM | POA: Diagnosis not present

## 2022-03-07 DIAGNOSIS — E119 Type 2 diabetes mellitus without complications: Secondary | ICD-10-CM | POA: Diagnosis not present

## 2022-03-07 DIAGNOSIS — M79671 Pain in right foot: Secondary | ICD-10-CM | POA: Diagnosis not present

## 2022-03-07 DIAGNOSIS — B351 Tinea unguium: Secondary | ICD-10-CM | POA: Diagnosis not present

## 2022-03-14 DIAGNOSIS — M9903 Segmental and somatic dysfunction of lumbar region: Secondary | ICD-10-CM | POA: Diagnosis not present

## 2022-03-14 DIAGNOSIS — M9904 Segmental and somatic dysfunction of sacral region: Secondary | ICD-10-CM | POA: Diagnosis not present

## 2022-03-14 DIAGNOSIS — M5137 Other intervertebral disc degeneration, lumbosacral region: Secondary | ICD-10-CM | POA: Diagnosis not present

## 2022-03-14 DIAGNOSIS — M9902 Segmental and somatic dysfunction of thoracic region: Secondary | ICD-10-CM | POA: Diagnosis not present

## 2022-03-14 DIAGNOSIS — M9901 Segmental and somatic dysfunction of cervical region: Secondary | ICD-10-CM | POA: Diagnosis not present

## 2022-03-22 DIAGNOSIS — E7800 Pure hypercholesterolemia, unspecified: Secondary | ICD-10-CM | POA: Diagnosis not present

## 2022-03-22 DIAGNOSIS — E1142 Type 2 diabetes mellitus with diabetic polyneuropathy: Secondary | ICD-10-CM | POA: Diagnosis not present

## 2022-03-22 DIAGNOSIS — Z7984 Long term (current) use of oral hypoglycemic drugs: Secondary | ICD-10-CM | POA: Diagnosis not present

## 2022-03-22 DIAGNOSIS — Z0000 Encounter for general adult medical examination without abnormal findings: Secondary | ICD-10-CM | POA: Diagnosis not present

## 2022-03-22 DIAGNOSIS — M25511 Pain in right shoulder: Secondary | ICD-10-CM | POA: Diagnosis not present

## 2022-03-22 DIAGNOSIS — I10 Essential (primary) hypertension: Secondary | ICD-10-CM | POA: Diagnosis not present

## 2022-03-28 DIAGNOSIS — M9904 Segmental and somatic dysfunction of sacral region: Secondary | ICD-10-CM | POA: Diagnosis not present

## 2022-03-28 DIAGNOSIS — M5137 Other intervertebral disc degeneration, lumbosacral region: Secondary | ICD-10-CM | POA: Diagnosis not present

## 2022-03-28 DIAGNOSIS — M9901 Segmental and somatic dysfunction of cervical region: Secondary | ICD-10-CM | POA: Diagnosis not present

## 2022-03-28 DIAGNOSIS — M9903 Segmental and somatic dysfunction of lumbar region: Secondary | ICD-10-CM | POA: Diagnosis not present

## 2022-03-28 DIAGNOSIS — M9902 Segmental and somatic dysfunction of thoracic region: Secondary | ICD-10-CM | POA: Diagnosis not present

## 2022-04-11 DIAGNOSIS — Z7984 Long term (current) use of oral hypoglycemic drugs: Secondary | ICD-10-CM | POA: Diagnosis not present

## 2022-04-11 DIAGNOSIS — M9904 Segmental and somatic dysfunction of sacral region: Secondary | ICD-10-CM | POA: Diagnosis not present

## 2022-04-11 DIAGNOSIS — M9901 Segmental and somatic dysfunction of cervical region: Secondary | ICD-10-CM | POA: Diagnosis not present

## 2022-04-11 DIAGNOSIS — M5137 Other intervertebral disc degeneration, lumbosacral region: Secondary | ICD-10-CM | POA: Diagnosis not present

## 2022-04-11 DIAGNOSIS — M9903 Segmental and somatic dysfunction of lumbar region: Secondary | ICD-10-CM | POA: Diagnosis not present

## 2022-04-11 DIAGNOSIS — I10 Essential (primary) hypertension: Secondary | ICD-10-CM | POA: Diagnosis not present

## 2022-04-11 DIAGNOSIS — E1142 Type 2 diabetes mellitus with diabetic polyneuropathy: Secondary | ICD-10-CM | POA: Diagnosis not present

## 2022-04-11 DIAGNOSIS — M9902 Segmental and somatic dysfunction of thoracic region: Secondary | ICD-10-CM | POA: Diagnosis not present

## 2022-04-12 DIAGNOSIS — M7581 Other shoulder lesions, right shoulder: Secondary | ICD-10-CM | POA: Diagnosis not present

## 2022-04-12 DIAGNOSIS — M19011 Primary osteoarthritis, right shoulder: Secondary | ICD-10-CM | POA: Diagnosis not present

## 2022-04-12 DIAGNOSIS — M25511 Pain in right shoulder: Secondary | ICD-10-CM | POA: Diagnosis not present

## 2022-04-25 DIAGNOSIS — M9902 Segmental and somatic dysfunction of thoracic region: Secondary | ICD-10-CM | POA: Diagnosis not present

## 2022-04-25 DIAGNOSIS — M5137 Other intervertebral disc degeneration, lumbosacral region: Secondary | ICD-10-CM | POA: Diagnosis not present

## 2022-04-25 DIAGNOSIS — M9901 Segmental and somatic dysfunction of cervical region: Secondary | ICD-10-CM | POA: Diagnosis not present

## 2022-04-25 DIAGNOSIS — M9903 Segmental and somatic dysfunction of lumbar region: Secondary | ICD-10-CM | POA: Diagnosis not present

## 2022-04-25 DIAGNOSIS — M9904 Segmental and somatic dysfunction of sacral region: Secondary | ICD-10-CM | POA: Diagnosis not present

## 2022-05-04 DIAGNOSIS — H5203 Hypermetropia, bilateral: Secondary | ICD-10-CM | POA: Diagnosis not present

## 2022-05-09 DIAGNOSIS — M9901 Segmental and somatic dysfunction of cervical region: Secondary | ICD-10-CM | POA: Diagnosis not present

## 2022-05-09 DIAGNOSIS — M9902 Segmental and somatic dysfunction of thoracic region: Secondary | ICD-10-CM | POA: Diagnosis not present

## 2022-05-09 DIAGNOSIS — M9904 Segmental and somatic dysfunction of sacral region: Secondary | ICD-10-CM | POA: Diagnosis not present

## 2022-05-09 DIAGNOSIS — M9903 Segmental and somatic dysfunction of lumbar region: Secondary | ICD-10-CM | POA: Diagnosis not present

## 2022-05-09 DIAGNOSIS — M5137 Other intervertebral disc degeneration, lumbosacral region: Secondary | ICD-10-CM | POA: Diagnosis not present

## 2022-05-15 DIAGNOSIS — R051 Acute cough: Secondary | ICD-10-CM | POA: Diagnosis not present

## 2022-05-15 DIAGNOSIS — R0781 Pleurodynia: Secondary | ICD-10-CM | POA: Diagnosis not present

## 2022-05-15 DIAGNOSIS — Z1152 Encounter for screening for COVID-19: Secondary | ICD-10-CM | POA: Diagnosis not present

## 2022-05-15 DIAGNOSIS — R079 Chest pain, unspecified: Secondary | ICD-10-CM | POA: Diagnosis not present

## 2022-05-15 DIAGNOSIS — J101 Influenza due to other identified influenza virus with other respiratory manifestations: Secondary | ICD-10-CM | POA: Diagnosis not present

## 2022-05-15 DIAGNOSIS — R059 Cough, unspecified: Secondary | ICD-10-CM | POA: Diagnosis not present

## 2022-05-15 DIAGNOSIS — R0989 Other specified symptoms and signs involving the circulatory and respiratory systems: Secondary | ICD-10-CM | POA: Diagnosis not present

## 2022-05-23 DIAGNOSIS — M9904 Segmental and somatic dysfunction of sacral region: Secondary | ICD-10-CM | POA: Diagnosis not present

## 2022-05-23 DIAGNOSIS — M9901 Segmental and somatic dysfunction of cervical region: Secondary | ICD-10-CM | POA: Diagnosis not present

## 2022-05-23 DIAGNOSIS — M9903 Segmental and somatic dysfunction of lumbar region: Secondary | ICD-10-CM | POA: Diagnosis not present

## 2022-05-23 DIAGNOSIS — M9902 Segmental and somatic dysfunction of thoracic region: Secondary | ICD-10-CM | POA: Diagnosis not present

## 2022-05-23 DIAGNOSIS — M5137 Other intervertebral disc degeneration, lumbosacral region: Secondary | ICD-10-CM | POA: Diagnosis not present

## 2022-05-30 DIAGNOSIS — M9903 Segmental and somatic dysfunction of lumbar region: Secondary | ICD-10-CM | POA: Diagnosis not present

## 2022-05-30 DIAGNOSIS — M9901 Segmental and somatic dysfunction of cervical region: Secondary | ICD-10-CM | POA: Diagnosis not present

## 2022-05-30 DIAGNOSIS — M9902 Segmental and somatic dysfunction of thoracic region: Secondary | ICD-10-CM | POA: Diagnosis not present

## 2022-05-30 DIAGNOSIS — M5137 Other intervertebral disc degeneration, lumbosacral region: Secondary | ICD-10-CM | POA: Diagnosis not present

## 2022-05-30 DIAGNOSIS — M9904 Segmental and somatic dysfunction of sacral region: Secondary | ICD-10-CM | POA: Diagnosis not present

## 2022-06-06 DIAGNOSIS — M79672 Pain in left foot: Secondary | ICD-10-CM | POA: Diagnosis not present

## 2022-06-06 DIAGNOSIS — E119 Type 2 diabetes mellitus without complications: Secondary | ICD-10-CM | POA: Diagnosis not present

## 2022-06-06 DIAGNOSIS — B351 Tinea unguium: Secondary | ICD-10-CM | POA: Diagnosis not present

## 2022-06-06 DIAGNOSIS — M79671 Pain in right foot: Secondary | ICD-10-CM | POA: Diagnosis not present

## 2022-06-20 DIAGNOSIS — M9901 Segmental and somatic dysfunction of cervical region: Secondary | ICD-10-CM | POA: Diagnosis not present

## 2022-06-20 DIAGNOSIS — M9902 Segmental and somatic dysfunction of thoracic region: Secondary | ICD-10-CM | POA: Diagnosis not present

## 2022-06-20 DIAGNOSIS — M9903 Segmental and somatic dysfunction of lumbar region: Secondary | ICD-10-CM | POA: Diagnosis not present

## 2022-06-20 DIAGNOSIS — M9904 Segmental and somatic dysfunction of sacral region: Secondary | ICD-10-CM | POA: Diagnosis not present

## 2022-06-20 DIAGNOSIS — M5137 Other intervertebral disc degeneration, lumbosacral region: Secondary | ICD-10-CM | POA: Diagnosis not present

## 2022-07-04 DIAGNOSIS — M5137 Other intervertebral disc degeneration, lumbosacral region: Secondary | ICD-10-CM | POA: Diagnosis not present

## 2022-07-04 DIAGNOSIS — M9904 Segmental and somatic dysfunction of sacral region: Secondary | ICD-10-CM | POA: Diagnosis not present

## 2022-07-04 DIAGNOSIS — M9902 Segmental and somatic dysfunction of thoracic region: Secondary | ICD-10-CM | POA: Diagnosis not present

## 2022-07-04 DIAGNOSIS — M9901 Segmental and somatic dysfunction of cervical region: Secondary | ICD-10-CM | POA: Diagnosis not present

## 2022-07-04 DIAGNOSIS — M9903 Segmental and somatic dysfunction of lumbar region: Secondary | ICD-10-CM | POA: Diagnosis not present

## 2022-07-17 DIAGNOSIS — H2513 Age-related nuclear cataract, bilateral: Secondary | ICD-10-CM | POA: Diagnosis not present

## 2022-07-17 DIAGNOSIS — H02831 Dermatochalasis of right upper eyelid: Secondary | ICD-10-CM | POA: Diagnosis not present

## 2022-07-17 DIAGNOSIS — H25013 Cortical age-related cataract, bilateral: Secondary | ICD-10-CM | POA: Diagnosis not present

## 2022-07-17 DIAGNOSIS — H2512 Age-related nuclear cataract, left eye: Secondary | ICD-10-CM | POA: Diagnosis not present

## 2022-07-17 DIAGNOSIS — H25043 Posterior subcapsular polar age-related cataract, bilateral: Secondary | ICD-10-CM | POA: Diagnosis not present

## 2022-07-18 DIAGNOSIS — E119 Type 2 diabetes mellitus without complications: Secondary | ICD-10-CM | POA: Insufficient documentation

## 2022-07-24 ENCOUNTER — Ambulatory Visit: Payer: Medicare HMO | Attending: Cardiology | Admitting: Cardiology

## 2022-07-24 ENCOUNTER — Encounter: Payer: Self-pay | Admitting: Cardiology

## 2022-07-24 ENCOUNTER — Other Ambulatory Visit (HOSPITAL_BASED_OUTPATIENT_CLINIC_OR_DEPARTMENT_OTHER): Payer: Self-pay

## 2022-07-24 VITALS — BP 162/64 | HR 66 | Ht 66.6 in | Wt 204.1 lb

## 2022-07-24 DIAGNOSIS — Z789 Other specified health status: Secondary | ICD-10-CM | POA: Diagnosis not present

## 2022-07-24 DIAGNOSIS — E782 Mixed hyperlipidemia: Secondary | ICD-10-CM

## 2022-07-24 DIAGNOSIS — I10 Essential (primary) hypertension: Secondary | ICD-10-CM | POA: Diagnosis not present

## 2022-07-24 DIAGNOSIS — E119 Type 2 diabetes mellitus without complications: Secondary | ICD-10-CM

## 2022-07-24 MED ORDER — BLOOD PRESSURE MONITOR MISC
1.0000 | Freq: Two times a day (BID) | 0 refills | Status: DC
  Filled 2022-07-24: qty 1, 30d supply, fill #0

## 2022-07-24 NOTE — Addendum Note (Signed)
Addended by: Truddie Hidden on: 07/24/2022 02:45 PM   Modules accepted: Orders

## 2022-07-24 NOTE — Patient Instructions (Signed)
Please keep a BP log for 2 weeks and send by MyChart or mail. Dr. Geraldo Pitter [place], [place] [address]  Blood Pressure Record Sheet To take your blood pressure, you will need a blood pressure machine. You can buy a blood pressure machine (blood pressure monitor) at your clinic, drug store, or online. When choosing one, consider: An automatic monitor that has an arm cuff. A cuff that wraps snugly around your upper arm. You should be able to fit only one finger between your arm and the cuff. A device that stores blood pressure reading results. Do not choose a monitor that measures your blood pressure from your wrist or finger. Follow your health care provider's instructions for how to take your blood pressure. To use this form: Get one reading in the morning (a.m.) 1-2 hours after you take any medicines. Get one reading in the evening (p.m.) before supper.   Blood pressure log Date: _______________________  a.m. _____________________(1st reading) HR___________            p.m. _____________________(2nd reading) HR__________  Date: _______________________  a.m. _____________________(1st reading) HR___________            p.m. _____________________(2nd reading) HR__________  Date: _______________________  a.m. _____________________(1st reading) HR___________            p.m. _____________________(2nd reading) HR__________  Date: _______________________  a.m. _____________________(1st reading) HR___________            p.m. _____________________(2nd reading) HR__________  Date: _______________________  a.m. _____________________(1st reading) HR___________            p.m. _____________________(2nd reading) HR__________  Date: _______________________  a.m. _____________________(1st reading) HR___________            p.m. _____________________(2nd reading) HR__________  Date: _______________________  a.m. _____________________(1st reading) HR___________             p.m. _____________________(2nd reading) HR__________   This information is not intended to replace advice given to you by your health care provider. Make sure you discuss any questions you have with your health care provider. Document Revised: 09/02/2019 Document Reviewed: 09/02/2019 Elsevier Patient Education  [place].   Medication Instructions:  Your physician recommends that you continue on your current medications as directed. Please refer to the Current Medication list given to you today.  *If you need a refill on your cardiac medications before your next appointment, please call your pharmacy*   Lab Work: None ordered If you have labs (blood work) drawn today and your tests are completely normal, you will receive your results only by: [place] (if you have MyChart) OR A paper copy in the mail If you have any lab test that is abnormal or we need to change your treatment, we will call you to review the results.   Testing/Procedures: None ordered   Follow-Up: At [facility], you and your health needs are our priority.  As part of our continuing mission to provide you with exceptional heart care, we have created designated Provider Care Teams.  These Care Teams include your primary Cardiologist (physician) and Advanced Practice Providers (APPs -  Physician Assistants and Nurse Practitioners) who all work together to provide you with the care you need, when you need it.  We recommend signing up for the patient portal called "MyChart".  Sign up information is provided on this After Visit Summary.  MyChart is used to connect with patients for Virtual Visits (Telemedicine).  Patients are able to view lab/test results, encounter notes, upcoming  appointments, etc.  Non-urgent messages can be sent to your provider as well.   To learn more about what you can do with MyChart, go to [email or web address].    Your next appointment:   6 month(s)  The format  for your next appointment:   In Person  Provider:   Jyl Heinz, MD    Other Instructions none  Important Information About Sugar

## 2022-07-24 NOTE — Progress Notes (Signed)
Cardiology Office Note:    Date:  07/24/2022   ID:  Thomas Rangel, DOB 03-29-1941, MRN [id number]  PCP:  [facility]  Cardiologist:  Jenean Lindau, MD   Referring MD: [facility]*    ASSESSMENT:    1. Essential hypertension   2. Diabetes mellitus without complication (HCC)   3. Statin intolerance   4. Mixed hyperlipidemia    PLAN:    In order of problems listed above:  Primary prevention stressed with the patient.  Importance of compliance with diet medication stressed any vocalized understanding.  I told him to keep up with his excellent exercise practices. Essential hypertension: Blood pressure stable and diet was emphasized.  Salt intake issues and lifestyle modification urged.  He was advised to keep track of pulse blood pressure and send it to me in 2 weeks and he agrees. Mixed dyslipidemia: I reviewed lipids.  He does not want to take any lipid-lowering medications.  He has had statin intolerance in the past.  I gave him other options he is not keen.  I respect his wishes. Diabetes mellitus: Followed by primary care.  Diet emphasized. Patient will be seen in follow-up appointment in 9 months or earlier if the patient has any concerns    Medication Adjustments/Labs and Tests Ordered: Current medicines are reviewed at length with the patient today.  Concerns regarding medicines are outlined above.  No orders of the defined types were placed in this encounter.  No orders of the defined types were placed in this encounter.    No chief complaint on file.    History of Present Illness:    Thomas Rangel is a 82 y.o. male.  Patient has past medical history of essential hypertension, diabetes mellitus and mixed dyslipidemia.  He has statin intolerance.  He denies any problems at this time and takes care of activities of daily living.  No chest pain orthopnea or PND.  This is the first time I evaluated him.  He states that he walks on a regular  basis at least half an hour a day.  He appears younger than stated age.  At the time of my evaluation, the patient is alert awake oriented and in no distress.  Past Medical History:  Diagnosis Date   Acute blood loss anemia 04/30/2017   AKI (acute kidney injury) ([place]) 04/30/2017   Candida esophagitis [facility]): Per EGD 05/01/2017 05/01/2017   Dehydration 04/30/2017   Diabetes mellitus without complication ([place])    Diabetic polyneuropathy ([place]) 09/09/2015   Diverticular hemorrhage: Probable 05/02/2017   Diverticulosis: Per colonoscopy 05/02/2017 05/02/2017   DM type 2 (diabetes mellitus, type 2) ([place]) 04/30/2017   ED (erectile dysfunction) 10/07/2019   Esophageal stenosis    Essential hypertension 09/09/2015   GIB (gastrointestinal bleeding) 04/30/2017   HTN (hypertension) 04/30/2017   Hyperlipidemia 09/09/2015   Hypokalemia 04/30/2017   Leukocytosis 04/30/2017   [place] grade A esophagitis    Other dysphagia 10/07/2019   Overweight (BMI 25.0-29.9) 09/13/2016   Statin intolerance 03/16/2020   Triceps tendon rupture, right, initial encounter 08/01/2017   Formatting of this note might be different from the original.  Added automatically from request for surgery [id number]   Type 2 diabetes mellitus with diabetic polyneuropathy, without long-term current use of insulin ([place]) 09/13/2016    Past Surgical History:  Procedure Laterality Date   COLONOSCOPY WITH PROPOFOL N/A 05/02/2017   Procedure: COLONOSCOPY WITH PROPOFOL;  Surgeon: Arta Silence, MD;  Location: [facility];  Service:  Endoscopy;  Laterality: N/A;   ESOPHAGOGASTRODUODENOSCOPY (EGD) WITH PROPOFOL Left 05/01/2017   Procedure: ESOPHAGOGASTRODUODENOSCOPY (EGD) WITH PROPOFOL;  Surgeon: Arta Silence, MD;  Location: [facility];  Service: Endoscopy;  Laterality: Left;    Current Medications: Current Meds  Medication Sig   acetaminophen (TYLENOL 8 HOUR ARTHRITIS PAIN) 650 MG CR tablet Take 650 mg by mouth every 8 (eight) hours  as needed for pain.   atorvastatin (LIPITOR) 10 MG tablet Take 10 mg by mouth every morning.   Cod Liver Oil 1000 MG CAPS Take 1 capsule by mouth at bedtime.   diltiazem (CARDIZEM CD) 180 MG 24 hr capsule Take 180 mg by mouth daily.   esomeprazole ([employer] ESOMEPRAZOLE MAGNESIUM) 20 MG capsule Take 20 mg by mouth daily at 12 noon.   furosemide (LASIX) 20 MG tablet Take one tablet by mouth daily for the next 3 days then take as needed for lower extremity edema thereafter   glimepiride (AMARYL) 2 MG tablet Take 2 mg by mouth daily with breakfast.   hydrochlorothiazide (HYDRODIURIL) 25 MG tablet Take 25 mg by mouth daily.   lisinopril (ZESTRIL) 40 MG tablet Take 40 mg by mouth daily.   metoprolol succinate (TOPROL XL) 25 MG 24 hr tablet Take 0.5 tablets (12.5 mg total) by mouth daily.   omega-3 acid ethyl esters (LOVAZA) 1 g capsule Take 2 g by mouth 2 (two) times daily.   pioglitazone (ACTOS) 30 MG tablet Take 30 mg by mouth daily.   potassium chloride (KLOR-CON) 10 MEQ tablet Take one tablet by mouth daily for the next 3 days then take as needed when you have to take the lasix thereafter   [DISCONTINUED] lisinopril (PRINIVIL,ZESTRIL) 30 MG tablet Take 30 mg by mouth daily.     Allergies:   Simvastatin, Metformin, and Penicillins   Social History   Socioeconomic History   Marital status: Single    Spouse name: Not on file   Number of children: Not on file   Years of education: Not on file   Highest education level: Not on file  Occupational History   Not on file  Tobacco Use   Smoking status: Never    Passive exposure: Past   Smokeless tobacco: Never  Vaping Use   Vaping Use: Never used  Substance and Sexual Activity   Alcohol use: No   Drug use: No   Sexual activity: Not on file  Other Topics Concern   Not on file  Social History Narrative   Not on file   Social Determinants of Health   Financial Resource Strain: Not on file  Food Insecurity: Not on file  Transportation  Needs: Not on file  Physical Activity: Not on file  Stress: Not on file  Social Connections: Not on file     Family History: The patient's family history includes CVA in his father and mother.  ROS:   Please see the history of present illness.    All other systems reviewed and are negative.  EKGs/Labs/Other Studies Reviewed:    The following studies were reviewed today: I discussed my findings with the patient at length.   Recent Labs: 09/26/2021: ALT 16; Hemoglobin 13.1; Platelets 198 12/04/2021: Magnesium 2.0 12/12/2021: BUN 13; Creatinine, Ser 1.29; Potassium 4.2; Sodium 142  Recent Lipid Panel    Component Value Date/Time   CHOL 120 05/01/2017 0214   TRIG 114 05/01/2017 0214   HDL 34 (L) 05/01/2017 0214   CHOLHDL 3.5 05/01/2017 0214   VLDL 23 05/01/2017 0214  [place] 63 05/01/2017 0214    Physical Exam:    VS:  BP (!) 162/64   Pulse 66   Ht 5' 6.6" (1.692 m)   Wt 204 lb 1.3 oz (92.6 kg)   SpO2 97%   BMI 32.35 kg/m     Wt Readings from Last 3 Encounters:  07/24/22 204 lb 1.3 oz (92.6 kg)  12/04/21 206 lb 9.6 oz (93.7 kg)  11/07/21 201 lb (91.2 kg)     GEN: Patient is in no acute distress HEENT: Normal NECK: No JVD; No carotid bruits LYMPHATICS: No lymphadenopathy CARDIAC: Hear sounds regular, 2/6 systolic murmur at the apex. RESPIRATORY:  Clear to auscultation without rales, wheezing or rhonchi  ABDOMEN: Soft, non-tender, non-distended MUSCULOSKELETAL:  No edema; No deformity  SKIN: Warm and dry NEUROLOGIC:  Alert and oriented x 3 PSYCHIATRIC:  Normal affect   Signed, Jenean Lindau, MD  07/24/2022 2:17 PM    [facility]

## 2022-08-17 ENCOUNTER — Ambulatory Visit: Payer: Medicare HMO

## 2022-08-17 ENCOUNTER — Telehealth: Payer: Self-pay

## 2022-08-17 NOTE — Telephone Encounter (Signed)
Dr. Geraldo Pitter requested pt have a nurse visit and keep a BP log 08/23/22. Pt aware and appointment made.

## 2022-08-20 DIAGNOSIS — H2512 Age-related nuclear cataract, left eye: Secondary | ICD-10-CM | POA: Diagnosis not present

## 2022-08-21 DIAGNOSIS — H2511 Age-related nuclear cataract, right eye: Secondary | ICD-10-CM | POA: Diagnosis not present

## 2022-08-23 ENCOUNTER — Telehealth: Payer: Self-pay | Admitting: Cardiology

## 2022-08-23 NOTE — Telephone Encounter (Signed)
Nurse visit rescheduled.

## 2022-08-23 NOTE — Telephone Encounter (Signed)
Patient stated he was out of town for his last Nurse Visit and wants to reschedule.

## 2022-08-29 DIAGNOSIS — E119 Type 2 diabetes mellitus without complications: Secondary | ICD-10-CM | POA: Diagnosis not present

## 2022-08-29 DIAGNOSIS — M79671 Pain in right foot: Secondary | ICD-10-CM | POA: Diagnosis not present

## 2022-08-29 DIAGNOSIS — M79672 Pain in left foot: Secondary | ICD-10-CM | POA: Diagnosis not present

## 2022-08-29 DIAGNOSIS — B351 Tinea unguium: Secondary | ICD-10-CM | POA: Diagnosis not present

## 2022-08-30 ENCOUNTER — Ambulatory Visit: Payer: Medicare HMO | Attending: Cardiology

## 2022-08-30 VITALS — BP 170/78 | HR 76 | Resp 18 | Ht 66.0 in | Wt 207.0 lb

## 2022-08-30 DIAGNOSIS — I10 Essential (primary) hypertension: Secondary | ICD-10-CM

## 2022-08-30 MED ORDER — METOPROLOL SUCCINATE ER 25 MG PO TB24
25.0000 mg | ORAL_TABLET | Freq: Two times a day (BID) | ORAL | 3 refills | Status: DC
Start: 2022-08-30 — End: 2023-09-26

## 2022-08-30 MED ORDER — HYDROCHLOROTHIAZIDE 25 MG PO TABS: 37.5000 mg | ORAL_TABLET | Freq: Every day | ORAL | 3 refills | Status: DC

## 2022-08-30 NOTE — Patient Instructions (Addendum)
Medication Instructions:  Your physician has recommended you make the following change in your medication:   Increase your Hydrochlorothiazide to 37.5 (1 1/2 tablets) daily  Increase your Metoprolol to 25 mg twice daily.  *If you need a refill on your cardiac medications before your next appointment, please call your pharmacy*   Lab Work: Your physician recommends that you return for lab work in:1 week for a BMP You need to have labs done when you are fasting. [facility] lab is located on the 3rd floor, [address]. Hours are Monday - Friday 8 am to 4 pm, closed 11:30 am to 1:00 pm. You do NOT need an appointment.   If you have labs (blood work) drawn today and your tests are completely normal, you will receive your results only by: [place] (if you have MyChart) OR A paper copy in the mail If you have any lab test that is abnormal or we need to change your treatment, we will call you to review the results.   Testing/Procedures: None ordered   Follow-Up: At [facility], you and your health needs are our priority.  As part of our continuing mission to provide you with exceptional heart care, we have created designated Provider Care Teams.  These Care Teams include your primary Cardiologist (physician) and Advanced Practice Providers (APPs -  Physician Assistants and Nurse Practitioners) who all work together to provide you with the care you need, when you need it.  We recommend signing up for the patient portal called "MyChart".  Sign up information is provided on this After Visit Summary.  MyChart is used to connect with patients for Virtual Visits (Telemedicine).  Patients are able to view lab/test results, encounter notes, upcoming appointments, etc.  Non-urgent messages can be sent to your provider as well.   To learn more about what you can do with MyChart, go to [email or web address].    Your next appointment:   As scheduled  The format for your next appointment:    In Person  Provider:   Jyl Heinz, MD    Other Instructions none  Important Information About Sugar

## 2022-08-30 NOTE — Progress Notes (Signed)
   Nurse Visit   Date of Encounter: 08/30/2022 ID: Thomas Rangel, DOB Jul 18, 1940, MRN [id number]  PCP:  Jenean Lindau, MD   [place] Providers Cardiologist:  Revankar   Visit Details   VS:  BP (!) 170/78 (BP Location: Left Arm, Patient Position: Sitting)   Pulse 76   Resp 18   Ht 5\' 6"  (1.676 m)   Wt 207 lb (93.9 kg)   BMI 33.41 kg/m  , BMI Body mass index is 33.41 kg/m.  Wt Readings from Last 3 Encounters:  08/30/22 207 lb (93.9 kg)  07/24/22 204 lb 1.3 oz (92.6 kg)  12/04/21 206 lb 9.6 oz (93.7 kg)     Reason for visit: BP and review BP log  Performed today: Vitals, Provider consulted:Revankar, and Education Changes (medications, testing, etc.) : Incresase HCTZ to 37.5 mg daily and Metoprolol 25 mg BID. Keep a BP log and labs in 1 week with a nurse visit Length of Visit: 15 minutes    Medications Adjustments/Labs and Tests Ordered: Orders Placed This Encounter  Procedures   Basic metabolic panel   Meds ordered this encounter  Medications   hydrochlorothiazide (HYDRODIURIL) 25 MG tablet    Sig: Take 1.5 tablets (37.5 mg total) by mouth daily.    Dispense:  135 tablet    Refill:  3   metoprolol succinate (TOPROL XL) 25 MG 24 hr tablet    Sig: Take 1 tablet (25 mg total) by mouth 2 (two) times daily.    Dispense:  180 tablet    Refill:  3     Signed, Truddie Hidden, RN  08/30/2022 8:58 AM

## 2022-09-05 ENCOUNTER — Ambulatory Visit: Payer: Medicare HMO | Attending: Cardiology

## 2022-09-05 VITALS — BP 140/64 | HR 74 | Ht 67.0 in | Wt 202.0 lb

## 2022-09-05 DIAGNOSIS — I10 Essential (primary) hypertension: Secondary | ICD-10-CM

## 2022-09-05 NOTE — Progress Notes (Signed)
   Nurse Visit   Date of Encounter: 09/05/2022 ID: Thomas Rangel, DOB 1940-06-11, MRN [id number]  PCP:  No primary care provider on file.   [facility] Providers Cardiologist:  None      Visit Details   VS:  BP (!) 140/64 (BP Location: Left Arm, Patient Position: Sitting)   Pulse 74   Ht 5\' 7"  (1.702 m)   Wt 202 lb (91.6 kg)   SpO2 97%   BMI 31.64 kg/m  , BMI Body mass index is 31.64 kg/m.  Wt Readings from Last 3 Encounters:  09/05/22 202 lb (91.6 kg)  08/30/22 207 lb (93.9 kg)  07/24/22 204 lb 1.3 oz (92.6 kg)     Reason for visit: Follow up on BP - BP log after increasing Metoprolol and HCTZ Performed today:   , Vitals, EKG, Provider consulted: Dr. Bing Matter , and Education Changes (medications, testing, etc.) : No Changes per Dr. Bing Matter- will send to Dr. Tomie China for review. BP log for review. Length of Visit: 15 minutes    Medications Adjustments/Labs and Tests Ordered: No orders of the defined types were placed in this encounter.  No orders of the defined types were placed in this encounter.    Signed, Neena Rhymes, RN  09/05/2022 11:44 AM

## 2022-09-06 LAB — BASIC METABOLIC PANEL
BUN/Creatinine Ratio: 16 (ref 10–24)
BUN: 20 mg/dL (ref 8–27)
CO2: 21 mmol/L (ref 20–29)
Calcium: 9.4 mg/dL (ref 8.6–10.2)
Chloride: 99 mmol/L (ref 96–106)
Creatinine, Ser: 1.22 mg/dL (ref 0.76–1.27)
Glucose: 195 mg/dL — ABNORMAL HIGH (ref 70–99)
Potassium: 4.1 mmol/L (ref 3.5–5.2)
Sodium: 138 mmol/L (ref 134–144)
eGFR: 60 mL/min/{1.73_m2} (ref >59)

## 2022-09-10 DIAGNOSIS — H2511 Age-related nuclear cataract, right eye: Secondary | ICD-10-CM | POA: Diagnosis not present

## 2022-09-14 ENCOUNTER — Telehealth: Payer: Self-pay

## 2022-09-14 MED ORDER — ISOSORBIDE MONONITRATE ER 60 MG PO TB24: 60.0000 mg | ORAL_TABLET | Freq: Every day | ORAL | 3 refills | Status: DC

## 2022-09-14 NOTE — Telephone Encounter (Signed)
Dr. Tomie China wants patient to start taking Imdur 60 mg daily and prescription will be sent per patient to [facility] on [address].  He will keep a blood pressure and pulse log for 1 week and return to clinic for a nurse visit.  Patient is agreeable and had no further questions.

## 2022-09-14 NOTE — Telephone Encounter (Signed)
Reviewed and updated patient's medication list on the phone per Dr. Tomie China.

## 2022-09-25 ENCOUNTER — Ambulatory Visit: Payer: Medicare HMO | Attending: Cardiology

## 2022-09-25 DIAGNOSIS — Z5181 Encounter for therapeutic drug level monitoring: Secondary | ICD-10-CM

## 2022-09-25 NOTE — Progress Notes (Signed)
   Nurse Visit   Date of Encounter: 09/25/2022 ID: Camron Monday, DOB [birth year], MRN [id number]  PCP:  Georgeanna Lea, MD   [facility] Providers Cardiologist:  None      Visit Details   VS:  BP (!) 148/78 (BP Location: Left Arm, Patient Position: Sitting)   Pulse 76   Ht 5\' 6"  (1.676 m)   Wt 201 lb (91.2 kg)   SpO2 97%   BMI 32.44 kg/m  , BMI Body mass index is 32.44 kg/m.  Wt Readings from Last 3 Encounters:  09/25/22 201 lb (91.2 kg)  09/05/22 202 lb (91.6 kg)  08/30/22 207 lb (93.9 kg)     Reason for visit: Follow up vitals after starting Imdur Performed today: Vitals, BP log reviewed Changes (medications, testing, etc.) : No changes- will send to Dr. Tomie China for review Length of Visit: 15 minutes    Medications Adjustments/Labs and Tests Ordered: No orders of the defined types were placed in this encounter.  No orders of the defined types were placed in this encounter.    Signed, Neena Rhymes, RN  09/25/2022 10:58 AM

## 2022-10-10 DIAGNOSIS — E1142 Type 2 diabetes mellitus with diabetic polyneuropathy: Secondary | ICD-10-CM | POA: Diagnosis not present

## 2022-10-10 DIAGNOSIS — Z7984 Long term (current) use of oral hypoglycemic drugs: Secondary | ICD-10-CM | POA: Diagnosis not present

## 2022-10-12 DIAGNOSIS — Z0100 Encounter for examination of eyes and vision without abnormal findings: Secondary | ICD-10-CM | POA: Diagnosis not present

## 2022-11-12 DIAGNOSIS — Z008 Encounter for other general examination: Secondary | ICD-10-CM | POA: Diagnosis not present

## 2022-11-22 DIAGNOSIS — M79671 Pain in right foot: Secondary | ICD-10-CM | POA: Diagnosis not present

## 2022-11-22 DIAGNOSIS — E119 Type 2 diabetes mellitus without complications: Secondary | ICD-10-CM | POA: Diagnosis not present

## 2022-11-22 DIAGNOSIS — B351 Tinea unguium: Secondary | ICD-10-CM | POA: Diagnosis not present

## 2022-11-22 DIAGNOSIS — L84 Corns and callosities: Secondary | ICD-10-CM | POA: Diagnosis not present

## 2022-11-22 DIAGNOSIS — M79672 Pain in left foot: Secondary | ICD-10-CM | POA: Diagnosis not present

## 2022-12-30 ENCOUNTER — Other Ambulatory Visit: Payer: Self-pay | Admitting: Physician Assistant

## 2022-12-31 NOTE — Telephone Encounter (Signed)
This is a [place] pt °

## 2023-02-11 DIAGNOSIS — E1165 Type 2 diabetes mellitus with hyperglycemia: Secondary | ICD-10-CM | POA: Diagnosis not present

## 2023-02-12 ENCOUNTER — Other Ambulatory Visit: Payer: Self-pay

## 2023-02-13 ENCOUNTER — Ambulatory Visit: Payer: Medicare HMO | Attending: Cardiology

## 2023-02-13 ENCOUNTER — Encounter: Payer: Self-pay | Admitting: Cardiology

## 2023-02-13 ENCOUNTER — Ambulatory Visit: Payer: Medicare HMO | Attending: Cardiology | Admitting: Cardiology

## 2023-02-13 VITALS — BP 124/60 | HR 70 | Ht 66.6 in | Wt 208.0 lb

## 2023-02-13 DIAGNOSIS — E1142 Type 2 diabetes mellitus with diabetic polyneuropathy: Secondary | ICD-10-CM

## 2023-02-13 DIAGNOSIS — R42 Dizziness and giddiness: Secondary | ICD-10-CM

## 2023-02-13 DIAGNOSIS — E669 Obesity, unspecified: Secondary | ICD-10-CM | POA: Insufficient documentation

## 2023-02-13 DIAGNOSIS — E782 Mixed hyperlipidemia: Secondary | ICD-10-CM | POA: Diagnosis not present

## 2023-02-13 DIAGNOSIS — I10 Essential (primary) hypertension: Secondary | ICD-10-CM | POA: Diagnosis not present

## 2023-02-13 IMAGING — DX DG CHEST 2V
2 series · 2 of 2 positions shown · non-contrast
Comparison: 04/30/2017

CLINICAL DATA: Shortness of breath and cough

EXAM:
CHEST - 2 VIEW

[chest pa]
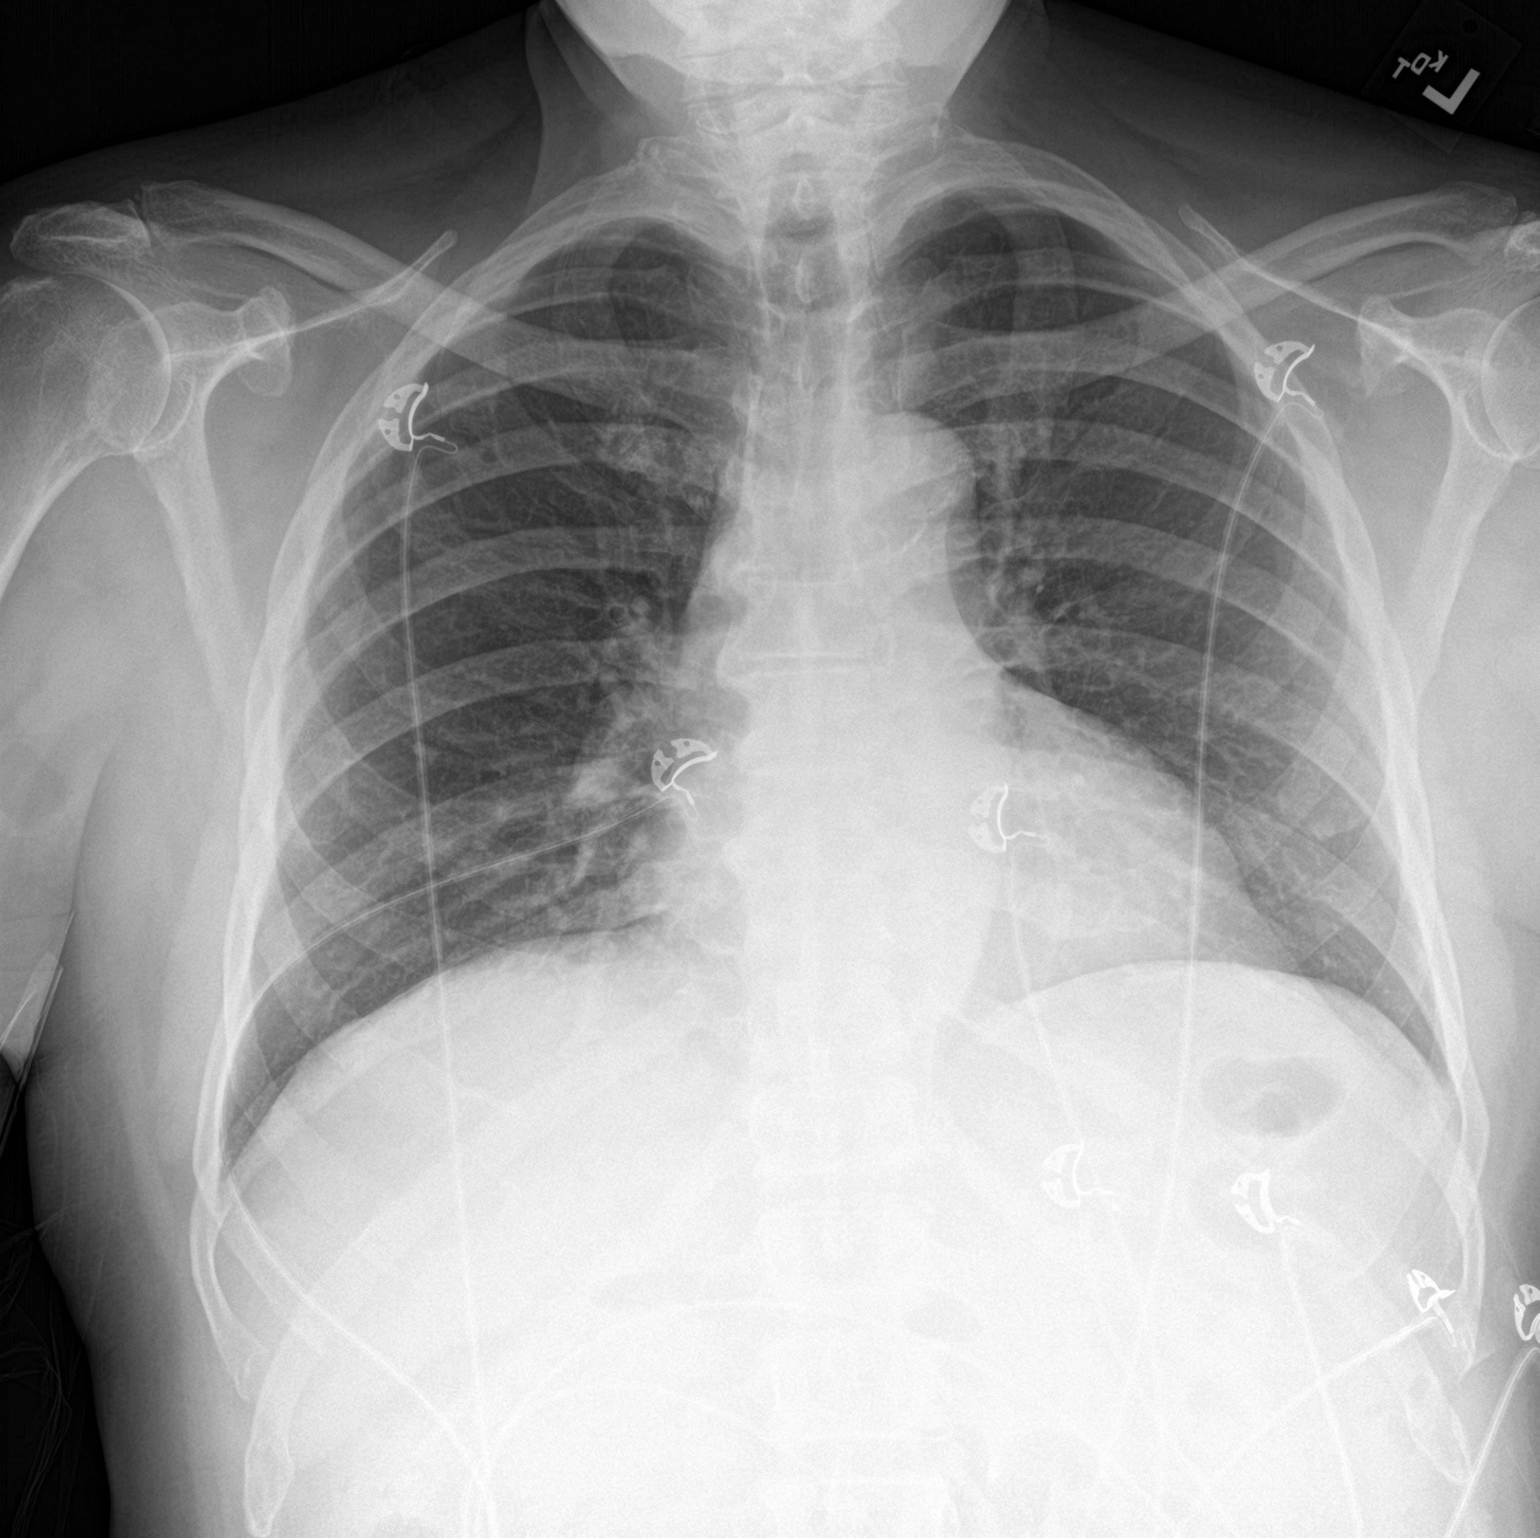

[chest lat]
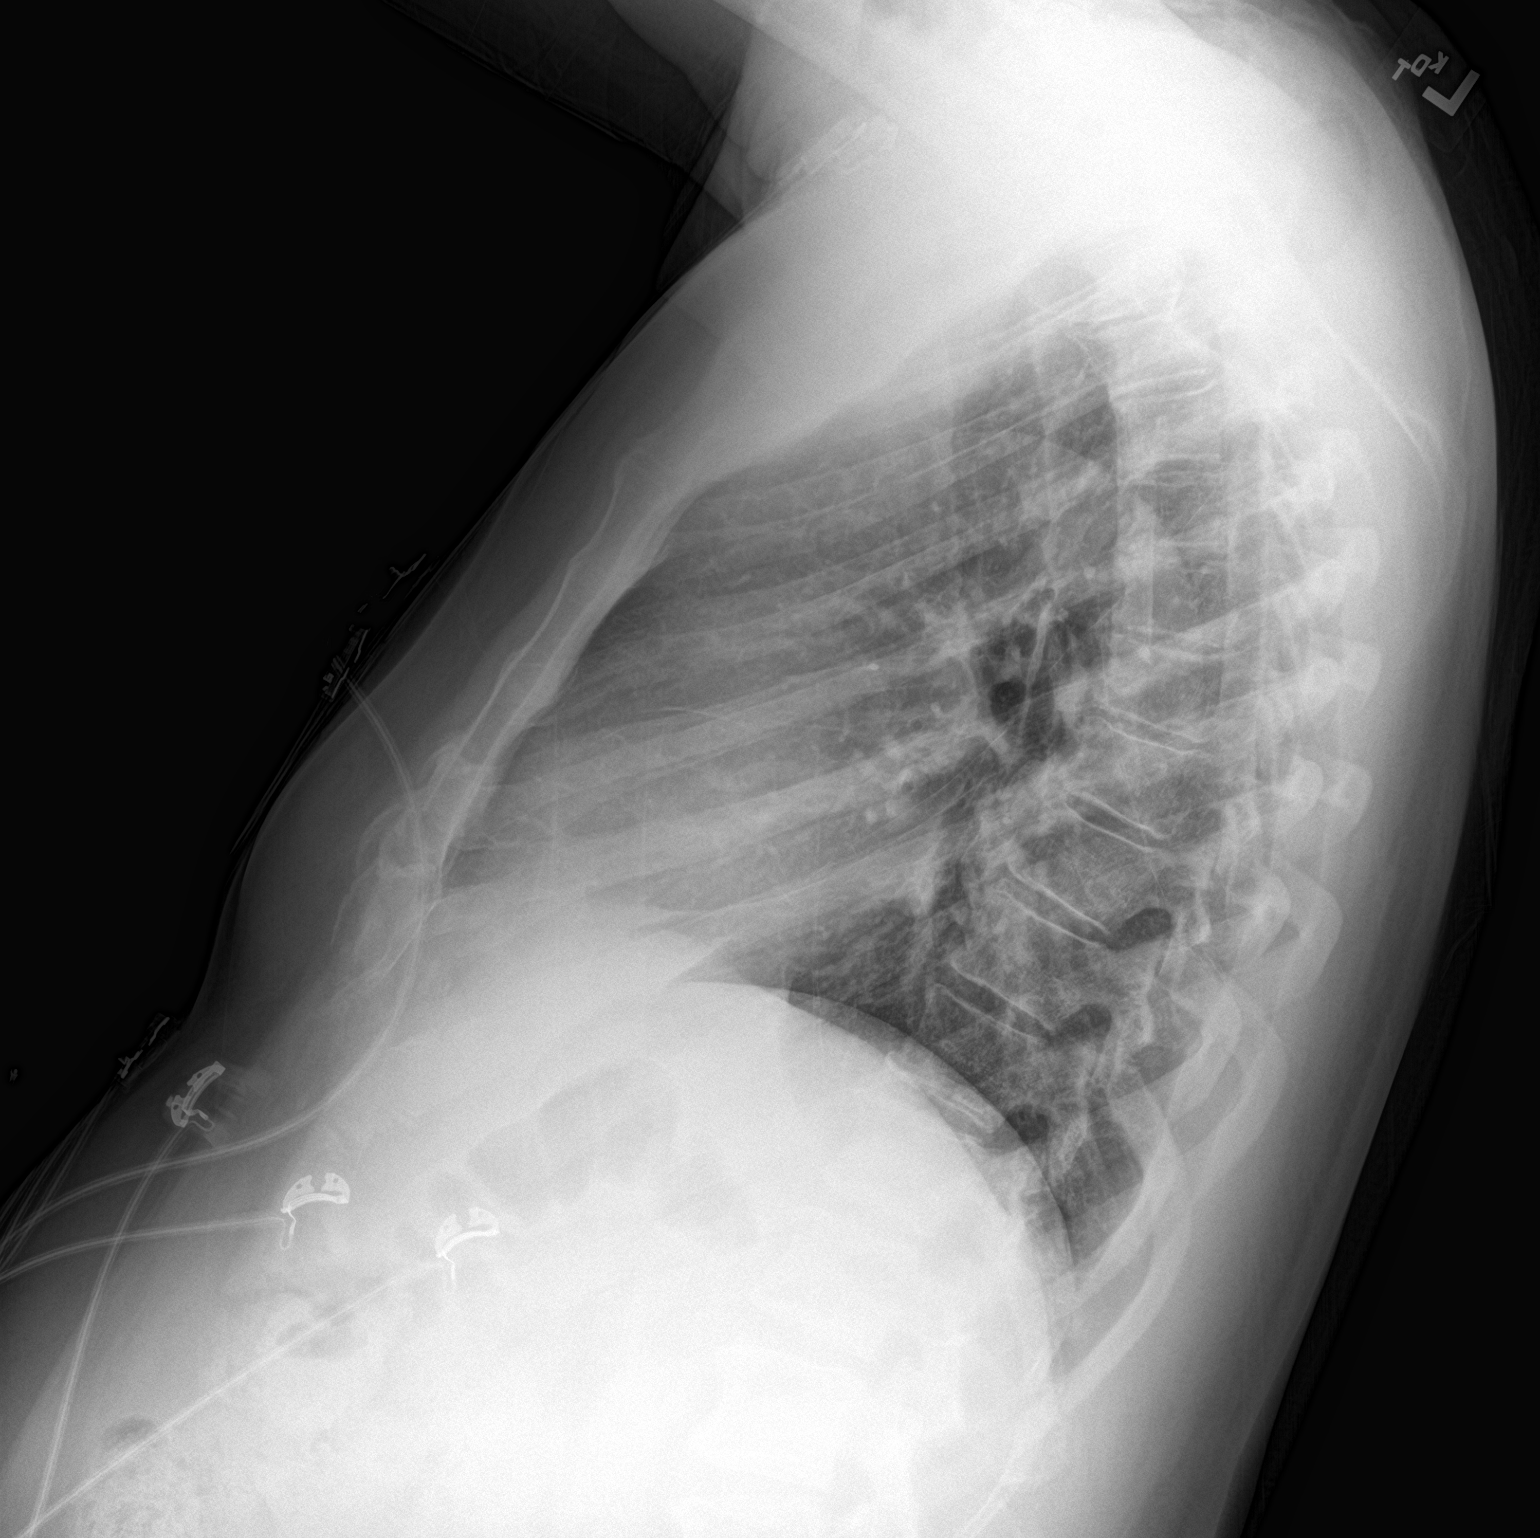

[2 of 2 positions shown; findings below may reference images not displayed]

FINDINGS: The heart size and mediastinal contours are within normal limits.
Both lungs are clear. The visualized skeletal structures are
unremarkable.
IMPRESSION: No active cardiopulmonary disease.

## 2023-02-13 NOTE — Progress Notes (Signed)
Cardiology Office Note:    Date:  02/13/2023   ID:  Thomas Rangel, DOB December 30, 1940, MRN [id number]  PCP:  Georgeanna Lea, MD  Cardiologist:  Garwin Brothers, MD   Referring MD: Georgeanna Lea, MD    ASSESSMENT:    1. Essential hypertension   2. Type 2 diabetes mellitus with diabetic polyneuropathy, without long-term current use of insulin (HCC)   3. Dizziness   4. Mixed hyperlipidemia   5. Obesity (BMI 30.0-34.9)    PLAN:    In order of problems listed above:  Primary prevention stressed with the patient.  Importance of compliance with diet medication stressed and patient verbalized standing. He was advised to walk at least half an hour a day 5 days a week and he promises to do so. Palpitations and dizziness: His blood pressure is borderline so have stopped his isosorbide.  Also will do a 2-week monitor to assess his symptoms.  He is going to primary care for blood work in the next 1 or 2 weeks.  He will send me a copy of that blood work. Essential hypertension: Blood pressure stable and diet was emphasized.  Again borderline blood pressure in view of symptoms Stopped isosorbide.  He will be back in 2 weeks for bringing as a blood pressure and pulse log twice a day. Mixed dyslipidemia: On lipid-lowering medications followed by primary care. Diabetes mellitus and obesity: Weight reduction stressed diet emphasized and he promises to do better. Patient will be seen in follow-up appointment in 6 months or earlier if the patient has any concerns.    Medication Adjustments/Labs and Tests Ordered: Current medicines are reviewed at length with the patient today.  Concerns regarding medicines are outlined above.  Orders Placed This Encounter  Procedures   LONG TERM MONITOR (3-14 DAYS)   No orders of the defined types were placed in this encounter.    No chief complaint on file.    History of Present Illness:    Thomas Rangel is a 82 y.o. male.  Patient has past medical  history of essential hypertension, dyslipidemia, diabetes mellitus and obesity.  He denies any problems except occasional feeling of dizziness.  No chest pain orthopnea or PND.  It appears to be postural.  Suggestive of orthostatic hypotension.  He also gives a history of palpitations at times very brief and not related to any symptoms.  At the time of my evaluation, the patient is alert awake oriented and in no distress.  He is an active gentleman.  Past Medical History:  Diagnosis Date   Acute blood loss anemia 04/30/2017   AKI (acute kidney injury) (HCC) 04/30/2017   Candida esophagitis [facility]): Per EGD 05/01/2017 05/01/2017   Dehydration 04/30/2017   Diabetes mellitus without complication (HCC)    Diabetic polyneuropathy (HCC) 09/09/2015   Diverticular hemorrhage: Probable 05/02/2017   Diverticulosis: Per colonoscopy 05/02/2017 05/02/2017   DM type 2 (diabetes mellitus, type 2) (HCC) 04/30/2017   ED (erectile dysfunction) 10/07/2019   Esophageal stenosis    Essential hypertension 09/09/2015   GIB (gastrointestinal bleeding) 04/30/2017   HTN (hypertension) 04/30/2017   Hyperlipidemia 09/09/2015   Hypokalemia 04/30/2017   Leukocytosis 04/30/2017   [place] grade A esophagitis    Other dysphagia 10/07/2019   Overweight (BMI 25.0-29.9) 09/13/2016   Statin intolerance 03/16/2020   Triceps tendon rupture, right, initial encounter 08/01/2017   Formatting of this note might be different from the original.  Added automatically from request for surgery [id number]   Type  2 diabetes mellitus with diabetic polyneuropathy, without long-term current use of insulin (HCC) 09/13/2016    Past Surgical History:  Procedure Laterality Date   COLONOSCOPY WITH PROPOFOL N/A 05/02/2017   Procedure: COLONOSCOPY WITH PROPOFOL;  Surgeon: Willis Modena, MD;  Location: [facility];  Service: Endoscopy;  Laterality: N/A;   ESOPHAGOGASTRODUODENOSCOPY (EGD) WITH PROPOFOL Left 05/01/2017   Procedure:  ESOPHAGOGASTRODUODENOSCOPY (EGD) WITH PROPOFOL;  Surgeon: Willis Modena, MD;  Location: [facility];  Service: Endoscopy;  Laterality: Left;    Current Medications: Current Meds  Medication Sig   acetaminophen (TYLENOL 8 HOUR ARTHRITIS PAIN) 650 MG CR tablet Take 650 mg by mouth every 8 (eight) hours as needed for pain.   atorvastatin (LIPITOR) 10 MG tablet Take 10 mg by mouth every morning.   Cod Liver Oil 1000 MG CAPS Take 1 capsule by mouth at bedtime.   diltiazem (CARDIZEM CD) 180 MG 24 hr capsule Take 180 mg by mouth daily.   esomeprazole ([facility] ESOMEPRAZOLE MAGNESIUM) 20 MG capsule Take 20 mg by mouth daily at 12 noon.   furosemide (LASIX) 20 MG tablet TAKE ONE TABLET BY MOUTH DAILY FOR THE NEXT 3 DAYS THEN TAKE AS NEEDED FOR LOWER EXTREMITY EDEMA THEREAFTER   glimepiride (AMARYL) 4 MG tablet Take 4 mg by mouth daily with breakfast.   hydrochlorothiazide (HYDRODIURIL) 25 MG tablet Take 1.5 tablets (37.5 mg total) by mouth daily.   lisinopril (ZESTRIL) 40 MG tablet Take 40 mg by mouth daily.   metoprolol succinate (TOPROL XL) 25 MG 24 hr tablet Take 1 tablet (25 mg total) by mouth 2 (two) times daily.   omega-3 acid ethyl esters (LOVAZA) 1 g capsule Take 1 g by mouth daily.   pioglitazone (ACTOS) 30 MG tablet Take 30 mg by mouth daily.   [DISCONTINUED] isosorbide mononitrate (IMDUR) 60 MG 24 hr tablet Take 1 tablet (60 mg total) by mouth daily.     Allergies:   Simvastatin, Metformin, and Penicillins   Social History   Socioeconomic History   Marital status: Single    Spouse name: Not on file   Number of children: Not on file   Years of education: Not on file   Highest education level: Not on file  Occupational History   Not on file  Tobacco Use   Smoking status: Never    Passive exposure: Past   Smokeless tobacco: Never  Vaping Use   Vaping status: Never Used  Substance and Sexual Activity   Alcohol use: No   Drug use: No   Sexual activity: Not on file  Other Topics  Concern   Not on file  Social History Narrative   Not on file   Social Determinants of Health   Financial Resource Strain: Not on file  Food Insecurity: Low Risk  (02/11/2023)   Received from [facility]   Hunger Vital Sign    Worried About Running Out of Food in the Last Year: Never true    Ran Out of Food in the Last Year: Never true  Transportation Needs: No Transportation Needs (02/11/2023)   Received from [facility]    In the past 12 months, has lack of reliable transportation kept you from medical appointments, meetings, work or from getting things needed for daily living? : No  Physical Activity: Not on file  Stress: Not on file  Social Connections: Not on file     Family History: The patient's family history includes CVA in his father and mother.  ROS:  Please see the history of present illness.    All other systems reviewed and are negative.  EKGs/Labs/Other Studies Reviewed:    The following studies were reviewed today: I discussed my findings with the patient at length   Recent Labs: 09/05/2022: BUN 20; Creatinine, Ser 1.22; Potassium 4.1; Sodium 138  Recent Lipid Panel    Component Value Date/Time   CHOL 120 05/01/2017 0214   TRIG 114 05/01/2017 0214   HDL 34 (L) 05/01/2017 0214   CHOLHDL 3.5 05/01/2017 0214   VLDL 23 05/01/2017 0214   LDLCALC 63 05/01/2017 0214    Physical Exam:    VS:  BP 124/60   Pulse 70   Ht 5' 6.6" (1.692 m)   Wt 208 lb (94.3 kg)   SpO2 97%   BMI 32.97 kg/m     Wt Readings from Last 3 Encounters:  02/13/23 208 lb (94.3 kg)  09/25/22 201 lb (91.2 kg)  09/05/22 202 lb (91.6 kg)     GEN: Patient is in no acute distress HEENT: Normal NECK: No JVD; No carotid bruits LYMPHATICS: No lymphadenopathy CARDIAC: Hear sounds regular, 2/6 systolic murmur at the apex. RESPIRATORY:  Clear to auscultation without rales, wheezing or rhonchi  ABDOMEN: Soft, non-tender, non-distended MUSCULOSKELETAL:  No edema;  No deformity  SKIN: Warm and dry NEUROLOGIC:  Alert and oriented x 3 PSYCHIATRIC:  Normal affect   Signed, Garwin Brothers, MD  02/13/2023 4:03 PM    [facility]

## 2023-02-13 NOTE — Patient Instructions (Addendum)
Please keep a BP log for 2 weeks and send by MyChart or mail. Dr. Tomie China [address] [place], [place] [address]  Blood Pressure Record Sheet To take your blood pressure, you will need a blood pressure machine. You can buy a blood pressure machine (blood pressure monitor) at your clinic, drug store, or online. When choosing one, consider: An automatic monitor that has an arm cuff. A cuff that wraps snugly around your upper arm. You should be able to fit only one finger between your arm and the cuff. A device that stores blood pressure reading results. Do not choose a monitor that measures your blood pressure from your wrist or finger. Follow your health care provider's instructions for how to take your blood pressure. To use this form: Get one reading in the morning (a.m.) 1-2 hours after you take any medicines. Get one reading in the evening (p.m.) before supper.   Blood pressure log Date: _______________________  a.m. _____________________(1st reading) HR___________            p.m. _____________________(2nd reading) HR__________  Date: _______________________  a.m. _____________________(1st reading) HR___________            p.m. _____________________(2nd reading) HR__________  Date: _______________________  a.m. _____________________(1st reading) HR___________            p.m. _____________________(2nd reading) HR__________  Date: _______________________  a.m. _____________________(1st reading) HR___________            p.m. _____________________(2nd reading) HR__________  Date: _______________________  a.m. _____________________(1st reading) HR___________            p.m. _____________________(2nd reading) HR__________  Date: _______________________  a.m. _____________________(1st reading) HR___________            p.m. _____________________(2nd reading) HR__________  Date: _______________________  a.m. _____________________(1st reading) HR___________             p.m. _____________________(2nd reading) HR__________   This information is not intended to replace advice given to you by your health care provider. Make sure you discuss any questions you have with your health care provider. Document Revised: 09/02/2019 Document Reviewed: 09/02/2019 Elsevier Patient Education  2021 Elsevier Inc.   Medication Instructions:  Your physician has recommended you make the following change in your medication:   Stop Isosorbide  *If you need a refill on your cardiac medications before your next appointment, please call your pharmacy*   Lab Work: None ordered If you have labs (blood work) drawn today and your tests are completely normal, you will receive your results only by: MyChart Message (if you have MyChart) OR A paper copy in the mail If you have any lab test that is abnormal or we need to change your treatment, we will call you to review the results.   Testing/Procedures: A zio monitor was ordered today. It will remain on for 14 days. Remove 02/27/2023. You will then return monitor and event diary in provided box. It takes 1-2 weeks for report to be downloaded and returned to [place]. We will call you with the results. If monitor falls off or has orange flashing light, please call [employer] for further instructions.    Follow-Up: At [facility], you and your health needs are our priority.  As part of our continuing mission to provide you with exceptional heart care, we have created designated Provider Care Teams.  These Care Teams include your primary Cardiologist (physician) and Advanced Practice Providers (APPs -  Physician Assistants and Nurse Practitioners) who all work together to provide you with the  care you need, when you need it.  We recommend signing up for the patient portal called "MyChart".  Sign up information is provided on this After Visit Summary.  MyChart is used to connect with patients for Virtual Visits (Telemedicine).  Patients  are able to view lab/test results, encounter notes, upcoming appointments, etc.  Non-urgent messages can be sent to your provider as well.   To learn more about what you can do with MyChart, go to [email or web address].    Your next appointment:   12 month(s)  The format for your next appointment:   In Person  Provider:   Gypsy Balsam, MD    Other Instructions none  Important Information About Sugar

## 2023-03-05 DIAGNOSIS — R42 Dizziness and giddiness: Secondary | ICD-10-CM | POA: Diagnosis not present

## 2023-03-25 DIAGNOSIS — M79671 Pain in right foot: Secondary | ICD-10-CM | POA: Diagnosis not present

## 2023-03-25 DIAGNOSIS — E119 Type 2 diabetes mellitus without complications: Secondary | ICD-10-CM | POA: Diagnosis not present

## 2023-03-25 DIAGNOSIS — L84 Corns and callosities: Secondary | ICD-10-CM | POA: Diagnosis not present

## 2023-03-25 DIAGNOSIS — M79672 Pain in left foot: Secondary | ICD-10-CM | POA: Diagnosis not present

## 2023-03-25 DIAGNOSIS — B351 Tinea unguium: Secondary | ICD-10-CM | POA: Diagnosis not present

## 2023-03-26 ENCOUNTER — Telehealth: Payer: Self-pay

## 2023-03-26 NOTE — Telephone Encounter (Signed)
-----   Message from Aundra Dubin Revankar sent at 03/21/2023  2:04 PM EDT ----- The results of the study is unremarkable. Please inform patient. I will discuss in detail at next appointment. Cc  primary care/referring physician Garwin Brothers, MD 03/21/2023 2:04 PM

## 2023-03-26 NOTE — Telephone Encounter (Signed)
Lvm to return call

## 2023-03-27 DIAGNOSIS — E782 Mixed hyperlipidemia: Secondary | ICD-10-CM | POA: Diagnosis not present

## 2023-03-27 DIAGNOSIS — E1142 Type 2 diabetes mellitus with diabetic polyneuropathy: Secondary | ICD-10-CM | POA: Diagnosis not present

## 2023-03-27 DIAGNOSIS — R1319 Other dysphagia: Secondary | ICD-10-CM | POA: Diagnosis not present

## 2023-03-27 DIAGNOSIS — Z0000 Encounter for general adult medical examination without abnormal findings: Secondary | ICD-10-CM | POA: Diagnosis not present

## 2023-03-27 DIAGNOSIS — I10 Essential (primary) hypertension: Secondary | ICD-10-CM | POA: Diagnosis not present

## 2023-03-27 DIAGNOSIS — D225 Melanocytic nevi of trunk: Secondary | ICD-10-CM | POA: Diagnosis not present

## 2023-03-27 DIAGNOSIS — Z7984 Long term (current) use of oral hypoglycemic drugs: Secondary | ICD-10-CM | POA: Diagnosis not present

## 2023-04-01 DIAGNOSIS — D1801 Hemangioma of skin and subcutaneous tissue: Secondary | ICD-10-CM | POA: Diagnosis not present

## 2023-04-01 DIAGNOSIS — L918 Other hypertrophic disorders of the skin: Secondary | ICD-10-CM | POA: Diagnosis not present

## 2023-04-01 DIAGNOSIS — B360 Pityriasis versicolor: Secondary | ICD-10-CM | POA: Diagnosis not present

## 2023-04-01 DIAGNOSIS — L821 Other seborrheic keratosis: Secondary | ICD-10-CM | POA: Diagnosis not present

## 2023-04-15 DIAGNOSIS — H524 Presbyopia: Secondary | ICD-10-CM | POA: Diagnosis not present

## 2023-06-17 DIAGNOSIS — E119 Type 2 diabetes mellitus without complications: Secondary | ICD-10-CM | POA: Diagnosis not present

## 2023-06-17 DIAGNOSIS — B351 Tinea unguium: Secondary | ICD-10-CM | POA: Diagnosis not present

## 2023-06-17 DIAGNOSIS — M79672 Pain in left foot: Secondary | ICD-10-CM | POA: Diagnosis not present

## 2023-06-17 DIAGNOSIS — M79671 Pain in right foot: Secondary | ICD-10-CM | POA: Diagnosis not present

## 2023-06-17 DIAGNOSIS — L84 Corns and callosities: Secondary | ICD-10-CM | POA: Diagnosis not present

## 2023-06-30 ENCOUNTER — Other Ambulatory Visit: Payer: Self-pay | Admitting: Cardiology

## 2023-06-30 DIAGNOSIS — I10 Essential (primary) hypertension: Secondary | ICD-10-CM

## 2023-07-11 DIAGNOSIS — E785 Hyperlipidemia, unspecified: Secondary | ICD-10-CM | POA: Diagnosis not present

## 2023-07-11 DIAGNOSIS — Z125 Encounter for screening for malignant neoplasm of prostate: Secondary | ICD-10-CM | POA: Diagnosis not present

## 2023-07-11 DIAGNOSIS — J309 Allergic rhinitis, unspecified: Secondary | ICD-10-CM | POA: Diagnosis not present

## 2023-07-11 DIAGNOSIS — Z6831 Body mass index (BMI) 31.0-31.9, adult: Secondary | ICD-10-CM | POA: Diagnosis not present

## 2023-07-11 DIAGNOSIS — Z0000 Encounter for general adult medical examination without abnormal findings: Secondary | ICD-10-CM | POA: Diagnosis not present

## 2023-07-11 DIAGNOSIS — E1165 Type 2 diabetes mellitus with hyperglycemia: Secondary | ICD-10-CM | POA: Diagnosis not present

## 2023-07-11 DIAGNOSIS — K219 Gastro-esophageal reflux disease without esophagitis: Secondary | ICD-10-CM | POA: Diagnosis not present

## 2023-07-11 DIAGNOSIS — E1121 Type 2 diabetes mellitus with diabetic nephropathy: Secondary | ICD-10-CM | POA: Diagnosis not present

## 2023-07-11 DIAGNOSIS — E559 Vitamin D deficiency, unspecified: Secondary | ICD-10-CM | POA: Diagnosis not present

## 2023-07-11 DIAGNOSIS — R0689 Other abnormalities of breathing: Secondary | ICD-10-CM | POA: Diagnosis not present

## 2023-07-11 DIAGNOSIS — Z79899 Other long term (current) drug therapy: Secondary | ICD-10-CM | POA: Diagnosis not present

## 2023-07-11 DIAGNOSIS — I10 Essential (primary) hypertension: Secondary | ICD-10-CM | POA: Diagnosis not present

## 2023-07-11 DIAGNOSIS — Z7984 Long term (current) use of oral hypoglycemic drugs: Secondary | ICD-10-CM | POA: Diagnosis not present

## 2023-07-11 DIAGNOSIS — E669 Obesity, unspecified: Secondary | ICD-10-CM | POA: Diagnosis not present

## 2023-09-26 ENCOUNTER — Other Ambulatory Visit: Payer: Self-pay | Admitting: Cardiology

## 2023-09-26 DIAGNOSIS — I10 Essential (primary) hypertension: Secondary | ICD-10-CM

## 2023-09-26 NOTE — Telephone Encounter (Signed)
 Prescription sent to pharmacy.

## 2023-12-22 ENCOUNTER — Other Ambulatory Visit: Payer: Self-pay | Admitting: Cardiology

## 2023-12-22 DIAGNOSIS — I10 Essential (primary) hypertension: Secondary | ICD-10-CM

## 2024-03-22 ENCOUNTER — Other Ambulatory Visit: Payer: Self-pay | Admitting: Cardiology

## 2024-03-22 DIAGNOSIS — I10 Essential (primary) hypertension: Secondary | ICD-10-CM

## 2024-05-19 ENCOUNTER — Other Ambulatory Visit: Payer: Self-pay | Admitting: Cardiology

## 2024-05-19 DIAGNOSIS — I10 Essential (primary) hypertension: Secondary | ICD-10-CM

## 2024-05-30 ENCOUNTER — Other Ambulatory Visit: Payer: Self-pay | Admitting: Cardiology

## 2024-05-30 DIAGNOSIS — I10 Essential (primary) hypertension: Secondary | ICD-10-CM
